# Patient Record
Sex: Male | Born: 1970 | Race: White | Hispanic: No | Smoking: Current every day smoker
Health system: Southern US, Community
[De-identification: ages and names within clinical notes are randomized; demographics above are authoritative.]

## PROBLEM LIST (undated history)

## (undated) DIAGNOSIS — F32A Depression, unspecified: Secondary | ICD-10-CM

## (undated) DIAGNOSIS — F1921 Other psychoactive substance dependence, in remission: Secondary | ICD-10-CM

## (undated) DIAGNOSIS — K219 Gastro-esophageal reflux disease without esophagitis: Secondary | ICD-10-CM

## (undated) DIAGNOSIS — Z9289 Personal history of other medical treatment: Secondary | ICD-10-CM

## (undated) DIAGNOSIS — F4310 Post-traumatic stress disorder, unspecified: Secondary | ICD-10-CM

## (undated) DIAGNOSIS — N200 Calculus of kidney: Secondary | ICD-10-CM

## (undated) DIAGNOSIS — Z09 Encounter for follow-up examination after completed treatment for conditions other than malignant neoplasm: Secondary | ICD-10-CM

## (undated) DIAGNOSIS — F419 Anxiety disorder, unspecified: Secondary | ICD-10-CM

## (undated) DIAGNOSIS — F329 Major depressive disorder, single episode, unspecified: Secondary | ICD-10-CM

## (undated) DIAGNOSIS — Z8781 Personal history of (healed) traumatic fracture: Secondary | ICD-10-CM

## (undated) DIAGNOSIS — Z8739 Personal history of other diseases of the musculoskeletal system and connective tissue: Secondary | ICD-10-CM

## (undated) HISTORY — DX: Gastro-esophageal reflux disease without esophagitis: K21.9

## (undated) HISTORY — PX: PLEURAL SCARIFICATION: SHX748

---

## 2001-03-28 ENCOUNTER — Encounter: Payer: Self-pay | Admitting: Emergency Medicine

## 2001-03-28 ENCOUNTER — Emergency Department (HOSPITAL_COMMUNITY): Admission: EM | Admit: 2001-03-28 | Discharge: 2001-03-28 | Payer: Self-pay | Admitting: Emergency Medicine

## 2003-01-04 ENCOUNTER — Emergency Department (HOSPITAL_COMMUNITY): Admission: EM | Admit: 2003-01-04 | Discharge: 2003-01-04 | Payer: Self-pay | Admitting: *Deleted

## 2003-04-27 ENCOUNTER — Emergency Department (HOSPITAL_COMMUNITY): Admission: EM | Admit: 2003-04-27 | Discharge: 2003-04-27 | Payer: Self-pay | Admitting: Emergency Medicine

## 2003-04-27 ENCOUNTER — Encounter: Payer: Self-pay | Admitting: Emergency Medicine

## 2003-05-29 ENCOUNTER — Encounter: Payer: Self-pay | Admitting: Emergency Medicine

## 2003-05-29 ENCOUNTER — Emergency Department (HOSPITAL_COMMUNITY): Admission: EM | Admit: 2003-05-29 | Discharge: 2003-05-29 | Payer: Self-pay | Admitting: Emergency Medicine

## 2003-07-18 ENCOUNTER — Emergency Department (HOSPITAL_COMMUNITY): Admission: EM | Admit: 2003-07-18 | Discharge: 2003-07-18 | Payer: Self-pay | Admitting: Emergency Medicine

## 2003-07-18 ENCOUNTER — Encounter: Payer: Self-pay | Admitting: Emergency Medicine

## 2003-09-05 ENCOUNTER — Emergency Department (HOSPITAL_COMMUNITY): Admission: EM | Admit: 2003-09-05 | Discharge: 2003-09-05 | Payer: Self-pay | Admitting: Emergency Medicine

## 2004-02-06 ENCOUNTER — Emergency Department (HOSPITAL_COMMUNITY): Admission: EM | Admit: 2004-02-06 | Discharge: 2004-02-06 | Payer: Self-pay | Admitting: Emergency Medicine

## 2005-06-10 ENCOUNTER — Emergency Department (HOSPITAL_COMMUNITY): Admission: EM | Admit: 2005-06-10 | Discharge: 2005-06-10 | Payer: Self-pay | Admitting: Emergency Medicine

## 2005-06-29 ENCOUNTER — Emergency Department (HOSPITAL_COMMUNITY): Admission: EM | Admit: 2005-06-29 | Discharge: 2005-06-29 | Payer: Self-pay | Admitting: Emergency Medicine

## 2005-09-02 ENCOUNTER — Emergency Department (HOSPITAL_COMMUNITY): Admission: EM | Admit: 2005-09-02 | Discharge: 2005-09-02 | Payer: Self-pay | Admitting: Emergency Medicine

## 2005-10-15 ENCOUNTER — Emergency Department (HOSPITAL_COMMUNITY): Admission: EM | Admit: 2005-10-15 | Discharge: 2005-10-15 | Payer: Self-pay | Admitting: Emergency Medicine

## 2005-10-17 ENCOUNTER — Emergency Department (HOSPITAL_COMMUNITY): Admission: EM | Admit: 2005-10-17 | Discharge: 2005-10-17 | Payer: Self-pay | Admitting: Emergency Medicine

## 2006-01-14 ENCOUNTER — Emergency Department (HOSPITAL_COMMUNITY): Admission: EM | Admit: 2006-01-14 | Discharge: 2006-01-14 | Payer: Self-pay | Admitting: Emergency Medicine

## 2006-12-16 ENCOUNTER — Emergency Department (HOSPITAL_COMMUNITY): Admission: EM | Admit: 2006-12-16 | Discharge: 2006-12-16 | Payer: Self-pay | Admitting: Emergency Medicine

## 2007-04-16 ENCOUNTER — Emergency Department (HOSPITAL_COMMUNITY): Admission: EM | Admit: 2007-04-16 | Discharge: 2007-04-17 | Payer: Self-pay | Admitting: Emergency Medicine

## 2007-07-21 ENCOUNTER — Emergency Department (HOSPITAL_COMMUNITY): Admission: EM | Admit: 2007-07-21 | Discharge: 2007-07-21 | Payer: Self-pay | Admitting: Emergency Medicine

## 2007-10-01 ENCOUNTER — Emergency Department (HOSPITAL_COMMUNITY): Admission: EM | Admit: 2007-10-01 | Discharge: 2007-10-01 | Payer: Self-pay | Admitting: Emergency Medicine

## 2007-10-05 ENCOUNTER — Emergency Department (HOSPITAL_COMMUNITY): Admission: EM | Admit: 2007-10-05 | Discharge: 2007-10-05 | Payer: Self-pay | Admitting: Emergency Medicine

## 2007-10-11 ENCOUNTER — Emergency Department (HOSPITAL_COMMUNITY): Admission: EM | Admit: 2007-10-11 | Discharge: 2007-10-11 | Payer: Self-pay | Admitting: Emergency Medicine

## 2007-10-12 ENCOUNTER — Emergency Department (HOSPITAL_COMMUNITY): Admission: EM | Admit: 2007-10-12 | Discharge: 2007-10-14 | Payer: Self-pay | Admitting: Emergency Medicine

## 2007-10-17 ENCOUNTER — Emergency Department (HOSPITAL_COMMUNITY): Admission: EM | Admit: 2007-10-17 | Discharge: 2007-10-17 | Payer: Self-pay | Admitting: Emergency Medicine

## 2007-10-21 ENCOUNTER — Emergency Department (HOSPITAL_COMMUNITY): Admission: EM | Admit: 2007-10-21 | Discharge: 2007-10-22 | Payer: Self-pay | Admitting: Emergency Medicine

## 2007-10-29 ENCOUNTER — Emergency Department (HOSPITAL_COMMUNITY): Admission: EM | Admit: 2007-10-29 | Discharge: 2007-10-29 | Payer: Self-pay | Admitting: Emergency Medicine

## 2007-11-01 ENCOUNTER — Emergency Department (HOSPITAL_COMMUNITY): Admission: EM | Admit: 2007-11-01 | Discharge: 2007-11-01 | Payer: Self-pay | Admitting: Emergency Medicine

## 2007-11-22 ENCOUNTER — Emergency Department (HOSPITAL_COMMUNITY): Admission: EM | Admit: 2007-11-22 | Discharge: 2007-11-22 | Payer: Self-pay | Admitting: Emergency Medicine

## 2008-03-03 ENCOUNTER — Emergency Department (HOSPITAL_COMMUNITY): Admission: EM | Admit: 2008-03-03 | Discharge: 2008-03-03 | Payer: Self-pay | Admitting: Emergency Medicine

## 2008-03-08 ENCOUNTER — Emergency Department (HOSPITAL_COMMUNITY): Admission: EM | Admit: 2008-03-08 | Discharge: 2008-03-09 | Payer: Self-pay | Admitting: Emergency Medicine

## 2008-03-14 ENCOUNTER — Emergency Department (HOSPITAL_COMMUNITY): Admission: EM | Admit: 2008-03-14 | Discharge: 2008-03-14 | Payer: Self-pay | Admitting: Emergency Medicine

## 2008-04-07 ENCOUNTER — Emergency Department (HOSPITAL_COMMUNITY): Admission: EM | Admit: 2008-04-07 | Discharge: 2008-04-07 | Payer: Self-pay | Admitting: Emergency Medicine

## 2009-02-03 IMAGING — CT CT PELVIS W/O CM
1 of 2 series · 15 of 32 positions shown, 19 images · non-contrast
Comparison: 10/17/2005

CT ABDOMEN

CLINICAL DATA: 37-year-old with right flank pain.

CT ABDOMEN AND PELVIS WITHOUT CONTRAST
TECHNIQUE: Multidetector CT imaging of the abdomen and pelvis was
performed following the standard
protocol without intravenous contrast.

[Series 2: stone 5.0 b40f · axial · 0.70mm/px · z∈[-512,-82]mm · 15 of 94 slices shown, 19 images]
[im 4/94  soft-tissue]
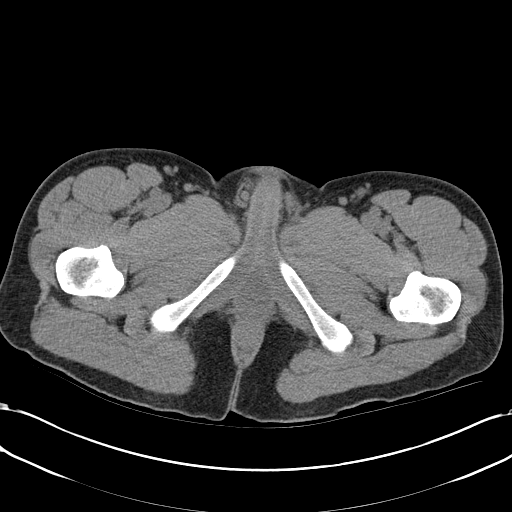
[im 4/94  bone]
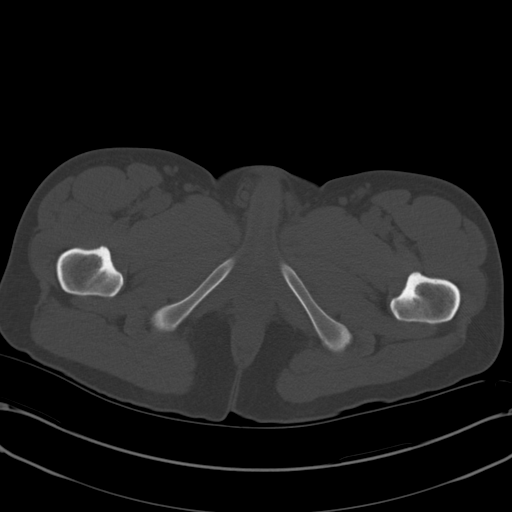
[im 12/94  soft-tissue]
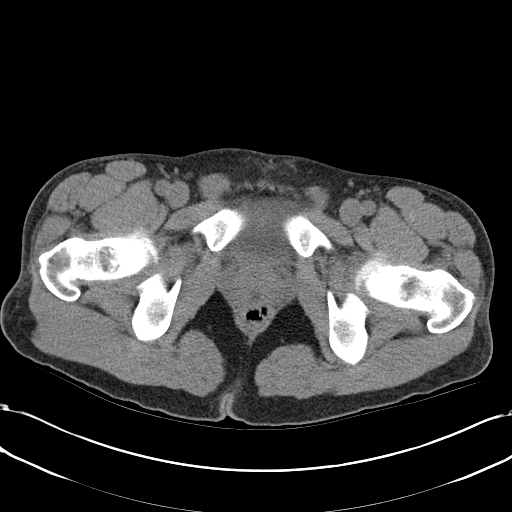
[im 19/94  soft-tissue]
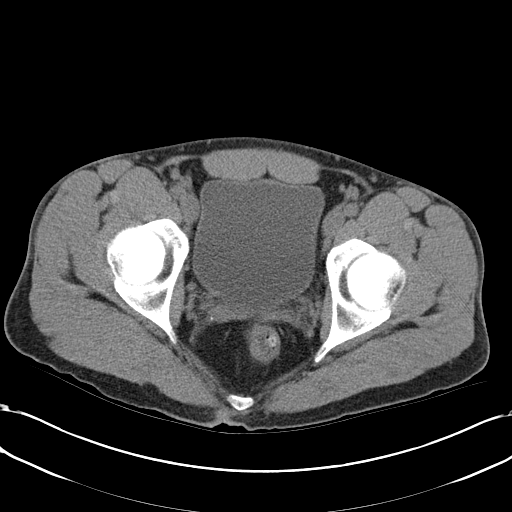
[im 27/94  soft-tissue]
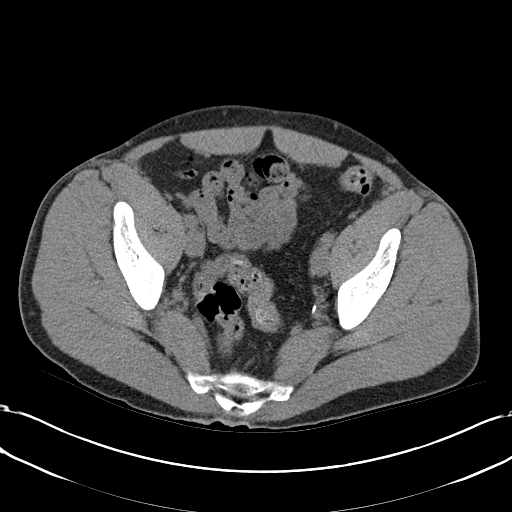
[im 34/94  soft-tissue]
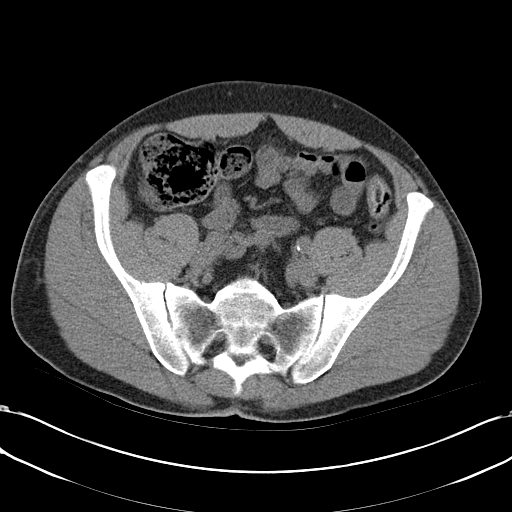
[im 41/94  soft-tissue]
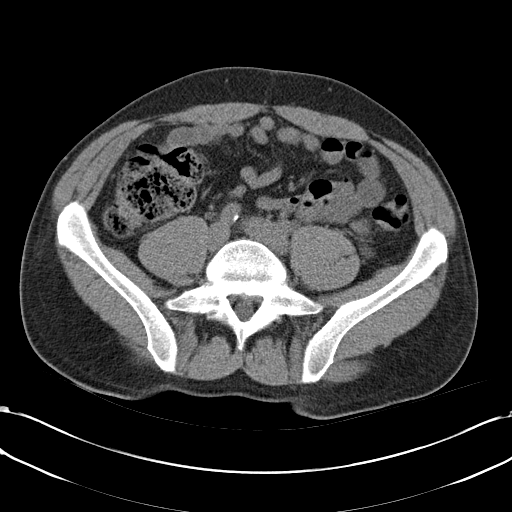
[im 49/94  soft-tissue]
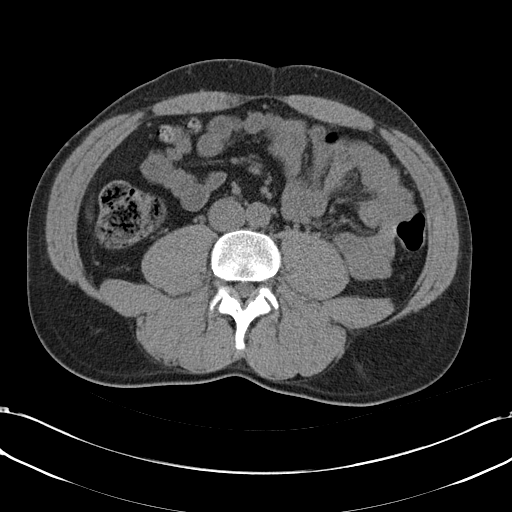
[im 53/94  soft-tissue]
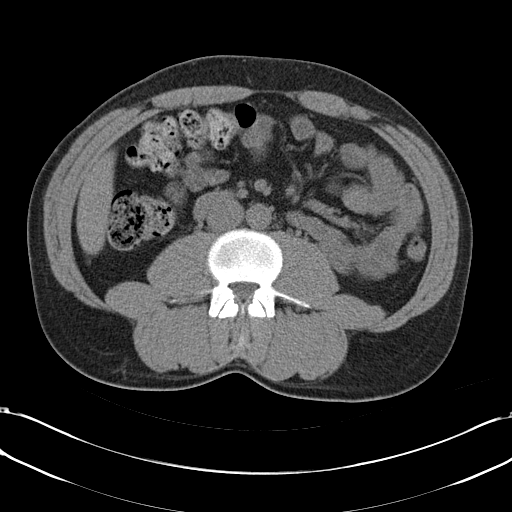
[im 60/94  soft-tissue]
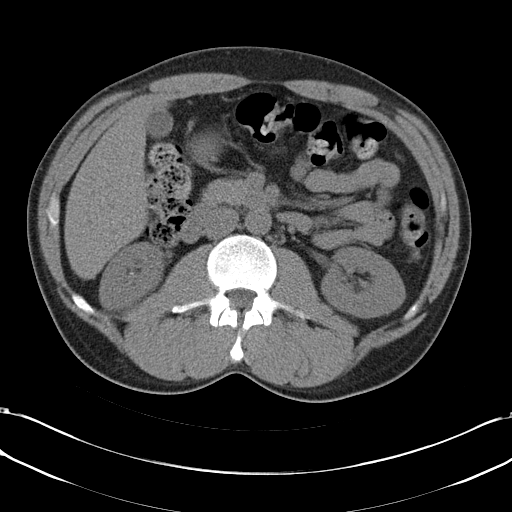
[im 60/94  bone]
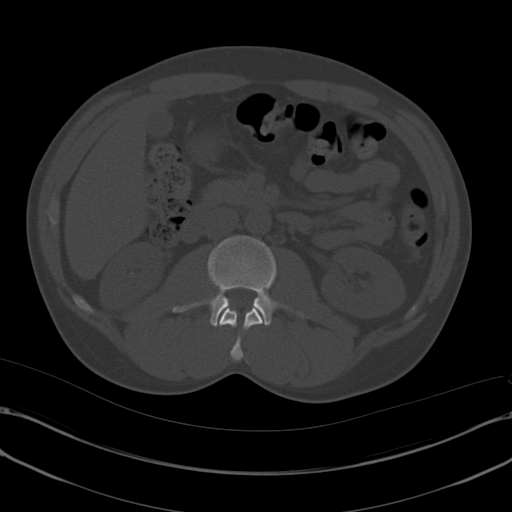
[im 67/94  soft-tissue]
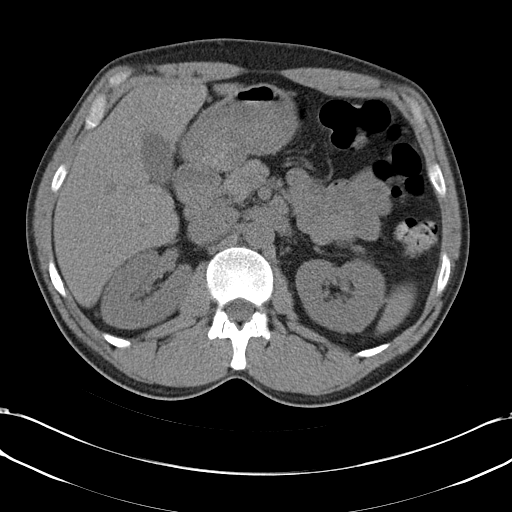
[im 75/94  soft-tissue]
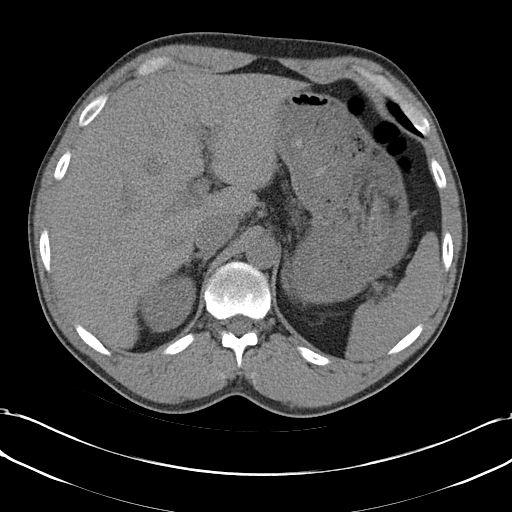
[im 79/94  lung]
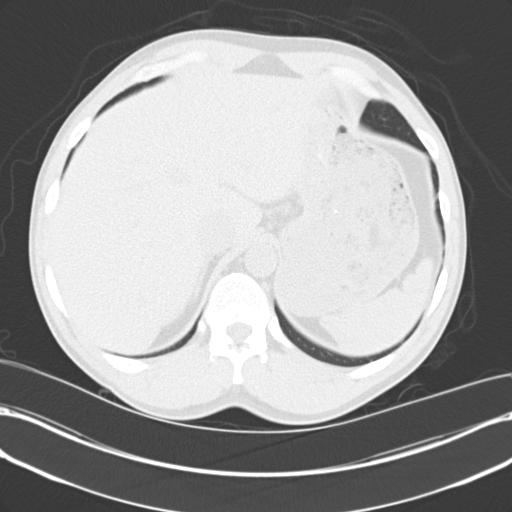
[im 82/94  soft-tissue]
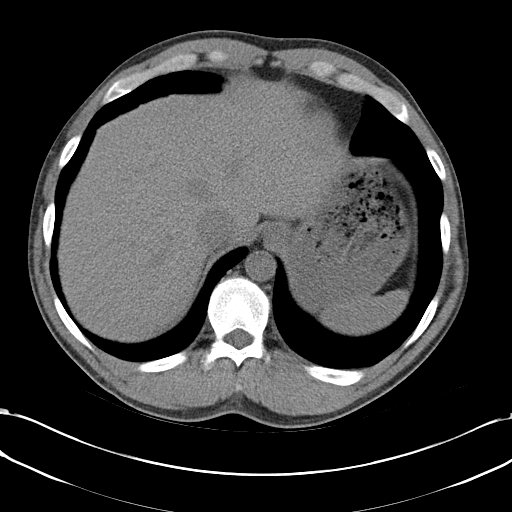
[im 82/94  lung]
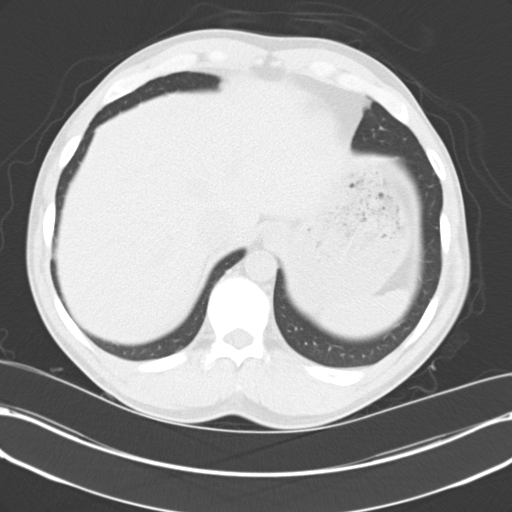
[im 86/94  lung]
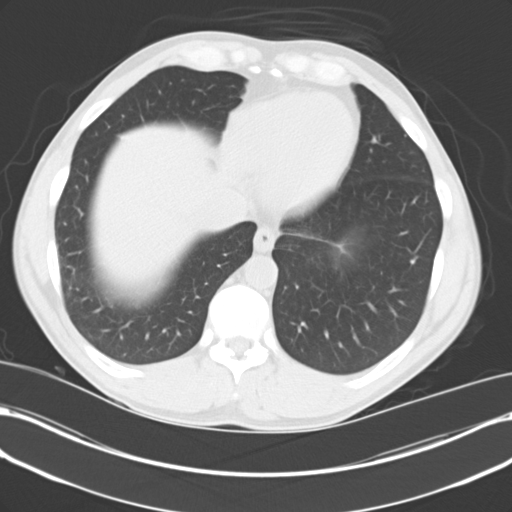
[im 90/94  soft-tissue]
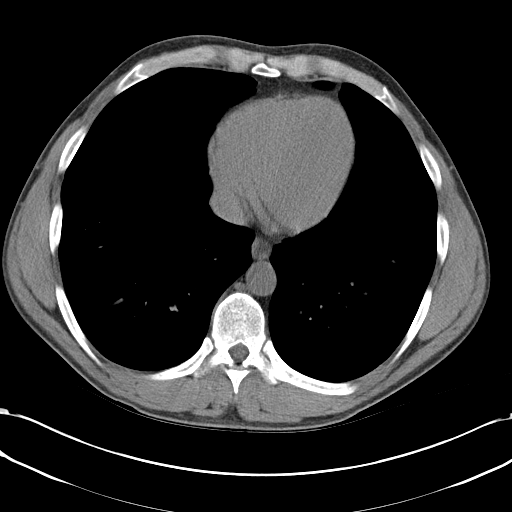
[im 90/94  lung]
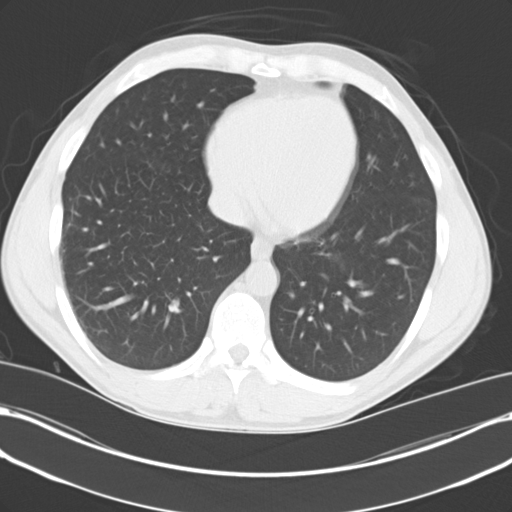

[15 of 32 positions shown; findings below may reference images not displayed]

FINDINGS: Evaluation of the intra-abdominal structures is
inherently limited without intravenous contrast.  The lung bases
are clear.  There is no evidence for free air.  There are no gross
abnormality involving the liver, gallbladder, spleen, pancreas,
adrenal glands or stomach.  There is a large amount of gastric
contents within the stomach.  There is a punctate stone in the
upper pole of the right kidney and a second punctate stone in the
lower pole of the right kidney.  No evidence for hydronephrosis or
ureteral stones.  There is no significant free fluid or
lymphadenopathy in the abdomen.  No acute bony abnormalities.
There is a stable sclerosis along the inferior endplate of T10.
IMPRESSION: Two punctate stones involving the right kidney without
hydronephrosis.

CT PELVIS
FINDINGS: Normal appearance of the distal colon, urinary bladder
and seminal vesicles.  Again seen is a low density area along the
posterior aspect of the prostate gland which is unchanged.  Cannot
exclude a small cystic structure in this area.  No significant free
fluid or lymphadenopathy in the pelvis.
IMPRESSION: Stable CT the pelvis without acute findings.

## 2009-03-27 ENCOUNTER — Emergency Department (HOSPITAL_COMMUNITY): Admission: EM | Admit: 2009-03-27 | Discharge: 2009-03-27 | Payer: Self-pay | Admitting: Emergency Medicine

## 2010-07-23 ENCOUNTER — Emergency Department (HOSPITAL_COMMUNITY): Admission: EM | Admit: 2010-07-23 | Discharge: 2010-07-23 | Payer: Self-pay | Admitting: Emergency Medicine

## 2010-08-03 ENCOUNTER — Emergency Department (HOSPITAL_COMMUNITY): Admission: EM | Admit: 2010-08-03 | Discharge: 2010-08-03 | Payer: Self-pay | Admitting: Emergency Medicine

## 2010-12-04 LAB — URINE CULTURE
Colony Count: NO GROWTH
Culture  Setup Time: 201111121943
Culture: NO GROWTH

## 2010-12-04 LAB — COMPREHENSIVE METABOLIC PANEL
ALT: 49 U/L (ref 0–53)
AST: 30 U/L (ref 0–37)
Albumin: 3.8 g/dL (ref 3.5–5.2)
Alkaline Phosphatase: 51 U/L (ref 39–117)
Chloride: 105 mEq/L (ref 96–112)
Potassium: 3.4 mEq/L — ABNORMAL LOW (ref 3.5–5.1)
Sodium: 138 mEq/L (ref 135–145)
Total Bilirubin: 0.5 mg/dL (ref 0.3–1.2)
Total Protein: 6.5 g/dL (ref 6.0–8.3)

## 2010-12-04 LAB — DIFFERENTIAL
Basophils Absolute: 0.1 10*3/uL (ref 0.0–0.1)
Basophils Relative: 1 % (ref 0–1)
Eosinophils Absolute: 0.1 10*3/uL (ref 0.0–0.7)
Eosinophils Relative: 1 % (ref 0–5)
Monocytes Absolute: 0.6 10*3/uL (ref 0.1–1.0)
Monocytes Relative: 5 % (ref 3–12)

## 2010-12-04 LAB — RAPID URINE DRUG SCREEN, HOSP PERFORMED
Amphetamines: NOT DETECTED
Barbiturates: NOT DETECTED
Opiates: POSITIVE — AB
Tetrahydrocannabinol: POSITIVE — AB

## 2010-12-04 LAB — URINALYSIS, ROUTINE W REFLEX MICROSCOPIC
Bilirubin Urine: NEGATIVE
Glucose, UA: NEGATIVE mg/dL
Protein, ur: NEGATIVE mg/dL
Urobilinogen, UA: 1 mg/dL (ref 0.0–1.0)

## 2010-12-04 LAB — ETHANOL: Alcohol, Ethyl (B): 9 mg/dL (ref 0–10)

## 2010-12-04 LAB — CBC
MCV: 90.4 fL (ref 78.0–100.0)
Platelets: 198 10*3/uL (ref 150–400)
RBC: 4.1 MIL/uL — ABNORMAL LOW (ref 4.22–5.81)
RDW: 14.8 % (ref 11.5–15.5)
WBC: 10.9 10*3/uL — ABNORMAL HIGH (ref 4.0–10.5)

## 2010-12-04 LAB — URINE MICROSCOPIC-ADD ON

## 2010-12-04 LAB — APTT: aPTT: 29 seconds (ref 24–37)

## 2010-12-05 LAB — URINALYSIS, ROUTINE W REFLEX MICROSCOPIC
Glucose, UA: NEGATIVE mg/dL
Specific Gravity, Urine: 1.02 (ref 1.005–1.030)
Urobilinogen, UA: 0.2 mg/dL (ref 0.0–1.0)
pH: 6.5 (ref 5.0–8.0)

## 2010-12-05 LAB — COMPREHENSIVE METABOLIC PANEL
ALT: 46 U/L (ref 0–53)
Albumin: 3.9 g/dL (ref 3.5–5.2)
Alkaline Phosphatase: 47 U/L (ref 39–117)
Chloride: 108 mEq/L (ref 96–112)
Glucose, Bld: 101 mg/dL — ABNORMAL HIGH (ref 70–99)
Potassium: 3.5 mEq/L (ref 3.5–5.1)
Sodium: 140 mEq/L (ref 135–145)
Total Protein: 7.1 g/dL (ref 6.0–8.3)

## 2010-12-05 LAB — DIFFERENTIAL
Basophils Relative: 0 % (ref 0–1)
Eosinophils Absolute: 0.1 10*3/uL (ref 0.0–0.7)
Monocytes Absolute: 0.5 10*3/uL (ref 0.1–1.0)
Monocytes Relative: 5 % (ref 3–12)

## 2010-12-05 LAB — CBC
HCT: 38.8 % — ABNORMAL LOW (ref 39.0–52.0)
Platelets: 212 10*3/uL (ref 150–400)
RBC: 4.3 MIL/uL (ref 4.22–5.81)
RDW: 14.2 % (ref 11.5–15.5)
WBC: 10.3 10*3/uL (ref 4.0–10.5)

## 2010-12-05 LAB — URINE MICROSCOPIC-ADD ON

## 2011-01-12 ENCOUNTER — Emergency Department (HOSPITAL_COMMUNITY): Payer: Self-pay

## 2011-01-12 ENCOUNTER — Emergency Department (HOSPITAL_COMMUNITY)
Admission: EM | Admit: 2011-01-12 | Discharge: 2011-01-12 | Disposition: A | Discharge status: Home / Self Care | Payer: Self-pay | Attending: Emergency Medicine | Admitting: Emergency Medicine

## 2011-01-12 DIAGNOSIS — R1032 Left lower quadrant pain: Secondary | ICD-10-CM | POA: Insufficient documentation

## 2011-01-12 DIAGNOSIS — R1031 Right lower quadrant pain: Secondary | ICD-10-CM | POA: Insufficient documentation

## 2011-01-12 DIAGNOSIS — R319 Hematuria, unspecified: Secondary | ICD-10-CM | POA: Insufficient documentation

## 2011-01-12 LAB — BASIC METABOLIC PANEL
BUN: 8 mg/dL (ref 6–23)
Creatinine, Ser: 0.77 mg/dL (ref 0.4–1.5)
GFR calc Af Amer: >60 mL/min (ref >60)
GFR calc non Af Amer: >60 mL/min (ref >60)
Potassium: 3.6 mEq/L (ref 3.5–5.1)

## 2011-01-12 LAB — URINE MICROSCOPIC-ADD ON

## 2011-01-12 LAB — URINALYSIS, ROUTINE W REFLEX MICROSCOPIC
Bilirubin Urine: NEGATIVE
Ketones, ur: NEGATIVE mg/dL
Protein, ur: NEGATIVE mg/dL
Urobilinogen, UA: 0.2 mg/dL (ref 0.0–1.0)

## 2011-01-13 LAB — URINE CULTURE

## 2011-06-13 LAB — DIFFERENTIAL
Basophils Absolute: 0.1
Basophils Absolute: 0.1
Eosinophils Absolute: 0.4
Eosinophils Relative: 0
Eosinophils Relative: 3
Lymphocytes Relative: 16
Monocytes Absolute: 1.1 — ABNORMAL HIGH
Neutro Abs: 10.4 — ABNORMAL HIGH

## 2011-06-13 LAB — BASIC METABOLIC PANEL
BUN: 13
BUN: 22
CO2: 29
Calcium: 9.3
Calcium: 9.6
Chloride: 103
Creatinine, Ser: 1.13
GFR calc non Af Amer: >60
GFR calc non Af Amer: >60
Glucose, Bld: 112 — ABNORMAL HIGH
Glucose, Bld: 126 — ABNORMAL HIGH
Glucose, Bld: 64 — ABNORMAL LOW
Potassium: 2.8 — ABNORMAL LOW
Sodium: 141

## 2011-06-13 LAB — URINALYSIS, ROUTINE W REFLEX MICROSCOPIC
Ketones, ur: NEGATIVE
Nitrite: NEGATIVE
Specific Gravity, Urine: 1.02
pH: 6

## 2011-06-13 LAB — CBC
HCT: 39.9
Hemoglobin: 13.1
MCV: 91.3
Platelets: 248
Platelets: 275
Platelets: 327
RDW: 15.2
RDW: 15.3
RDW: 15.7 — ABNORMAL HIGH

## 2011-06-13 LAB — RAPID URINE DRUG SCREEN, HOSP PERFORMED
Amphetamines: NOT DETECTED
Barbiturates: NOT DETECTED
Cocaine: POSITIVE — AB
Cocaine: POSITIVE — AB
Opiates: POSITIVE — AB
Opiates: POSITIVE — AB
Tetrahydrocannabinol: POSITIVE — AB

## 2011-06-13 LAB — POTASSIUM: Potassium: 3.6

## 2011-06-14 LAB — RAPID URINE DRUG SCREEN, HOSP PERFORMED
Benzodiazepines: POSITIVE — AB
Tetrahydrocannabinol: NOT DETECTED

## 2011-06-14 LAB — I-STAT 8, (EC8 V) (CONVERTED LAB)
Acid-Base Excess: 1
Bicarbonate: 28 — ABNORMAL HIGH
Glucose, Bld: 135 — ABNORMAL HIGH
HCT: 45
Hemoglobin: 15.3
Operator id: 151321
Potassium: 3.8
Sodium: 139
TCO2: 30

## 2011-06-14 LAB — DIFFERENTIAL
Basophils Absolute: 0.1
Eosinophils Relative: 3
Lymphocytes Relative: 29
Lymphs Abs: 2.7
Neutro Abs: 5.7

## 2011-06-14 LAB — POCT I-STAT CREATININE: Operator id: 151321

## 2011-06-14 LAB — CBC
HCT: 40
Hemoglobin: 13.4
Platelets: 299
WBC: 9.3

## 2011-06-14 LAB — ETHANOL: Alcohol, Ethyl (B): <5

## 2011-06-17 LAB — DIFFERENTIAL
Band Neutrophils: 0
Eosinophils Relative: 1
Metamyelocytes Relative: 0
Monocytes Relative: 9
Myelocytes: 0

## 2011-06-17 LAB — BASIC METABOLIC PANEL
Chloride: 109
GFR calc non Af Amer: >60
Glucose, Bld: 107 — ABNORMAL HIGH
Potassium: 4.3
Sodium: 141

## 2011-06-17 LAB — RAPID URINE DRUG SCREEN, HOSP PERFORMED
Amphetamines: NOT DETECTED
Benzodiazepines: POSITIVE — AB
Cocaine: POSITIVE — AB
Opiates: NOT DETECTED

## 2011-06-17 LAB — ACETAMINOPHEN LEVEL: Acetaminophen (Tylenol), Serum: <10 — ABNORMAL LOW

## 2011-06-17 LAB — CBC
HCT: 40.2
Hemoglobin: 14
MCV: 92.3
RDW: 15.1
WBC: 10.8 — ABNORMAL HIGH

## 2011-06-20 LAB — WOUND CULTURE

## 2011-06-21 LAB — URINALYSIS, ROUTINE W REFLEX MICROSCOPIC
Bilirubin Urine: NEGATIVE
Ketones, ur: NEGATIVE
Nitrite: NEGATIVE
Protein, ur: NEGATIVE
Urobilinogen, UA: 0.2

## 2011-09-09 ENCOUNTER — Emergency Department (HOSPITAL_COMMUNITY)
Admission: EM | Admit: 2011-09-09 | Discharge: 2011-09-09 | Disposition: A | Discharge status: Home / Self Care | Payer: Self-pay | Attending: Emergency Medicine | Admitting: Emergency Medicine

## 2011-09-09 ENCOUNTER — Encounter: Payer: Self-pay | Admitting: Emergency Medicine

## 2011-09-09 DIAGNOSIS — M549 Dorsalgia, unspecified: Secondary | ICD-10-CM | POA: Insufficient documentation

## 2011-09-09 DIAGNOSIS — S335XXA Sprain of ligaments of lumbar spine, initial encounter: Secondary | ICD-10-CM | POA: Insufficient documentation

## 2011-09-09 DIAGNOSIS — S39012A Strain of muscle, fascia and tendon of lower back, initial encounter: Secondary | ICD-10-CM

## 2011-09-09 DIAGNOSIS — X503XXA Overexertion from repetitive movements, initial encounter: Secondary | ICD-10-CM | POA: Insufficient documentation

## 2011-09-09 MED ORDER — NAPROXEN 500 MG PO TABS: 500.0000 mg | ORAL_TABLET | Freq: Two times a day (BID) | ORAL | Status: DC

## 2011-09-09 MED ORDER — DIAZEPAM 5 MG PO TABS: 5.0000 mg | ORAL_TABLET | Freq: Two times a day (BID) | ORAL | Status: AC

## 2011-09-09 MED ORDER — HYDROCODONE-ACETAMINOPHEN 5-325 MG PO TABS: 1.0000 | ORAL_TABLET | ORAL | Status: AC | PRN

## 2011-09-09 MED ORDER — PREDNISONE 50 MG PO TABS: 50.0000 mg | ORAL_TABLET | Freq: Every day | ORAL | Status: AC

## 2011-09-09 NOTE — ED Provider Notes (Signed)
History     CSN: [id number] Arrival date & time: 09/09/2011  7:27 AM   First MD Initiated Contact with Patient 09/09/11 [phone]      Chief Complaint  Patient presents with  . Back Pain    (Consider location/radiation/quality/duration/timing/severity/associated sxs/prior treatment) HPI Comments: Patient was lifting heavy cinder blocks yesterday and since that time his increased pain in his left lumbar back.  Pain does radiate down his left leg.  He notes a tingling sensation his left foot but is able to ambulate.  No focal weakness.  No loss of bowel or bladder control.  No prior back injuries or surgeries.  Patient is a 40 y.o. male presenting with back pain. The history is provided by the patient. No language interpreter was used.  Back Pain  This is a new problem. The current episode started yesterday. The problem occurs constantly. The problem has not changed since onset.The pain is associated with lifting heavy objects. The pain is present in the lumbar spine. The quality of the pain is described as shooting. The pain radiates to the left thigh. The pain is moderate. The symptoms are aggravated by bending, certain positions and twisting. Associated symptoms include leg pain, paresthesias and tingling. Pertinent negatives include no chest pain, no fever, no numbness, no weight loss, no headaches, no abdominal pain, no abdominal swelling, no bowel incontinence, no perianal numbness, no bladder incontinence, no dysuria, no pelvic pain, no paresis and no weakness. He has tried NSAIDs for the symptoms. The treatment provided mild relief.    History reviewed. No pertinent past medical history.  History reviewed. No pertinent past surgical history.  History reviewed. No pertinent family history.  History  Substance Use Topics  . Smoking status: Current Everyday Smoker -- 1.0 packs/day    Types: Cigarettes  . Smokeless tobacco: Not on file  . Alcohol Use: No      Review of Systems    Constitutional: Negative.  Negative for fever, chills and weight loss.  HENT: Negative.   Eyes: Negative.  Negative for discharge and redness.  Respiratory: Negative.  Negative for cough and shortness of breath.   Cardiovascular: Negative.  Negative for chest pain.  Gastrointestinal: Negative.  Negative for nausea, vomiting, abdominal pain and bowel incontinence.  Genitourinary: Negative.  Negative for bladder incontinence, dysuria, hematuria and pelvic pain.  Musculoskeletal: Positive for back pain.  Skin: Negative.  Negative for color change and rash.  Neurological: Positive for tingling and paresthesias. Negative for syncope, weakness, numbness and headaches.  Hematological: Negative.  Negative for adenopathy.  Psychiatric/Behavioral: Negative.  Negative for confusion.  All other systems reviewed and are negative.    Allergies  Sulfa antibiotics  Home Medications   Current Outpatient Rx  Name Route Sig Dispense Refill  . IBUPROFEN 200 MG PO TABS Oral Take 400 mg by mouth every 6 (six) hours as needed. pain       BP 130/69  Pulse 84  Temp(Src) 98.6 F (37 C) (Oral)  Resp 16  SpO2 98%  Physical Exam  Constitutional: He is oriented to person, place, and time. He appears well-developed and well-nourished.  Non-toxic appearance. He does not have a sickly appearance.  HENT:  Head: Normocephalic and atraumatic.  Eyes: Conjunctivae, EOM and lids are normal. Pupils are equal, round, and reactive to light.  Neck: Trachea normal, normal range of motion and full passive range of motion without pain. Neck supple.  Cardiovascular: Regular rhythm and normal heart sounds.   Pulmonary/Chest: Effort normal and  breath sounds normal. No respiratory distress.  Abdominal: Soft. Normal appearance. He exhibits no distension. There is no tenderness. There is no rebound and no CVA tenderness.  Musculoskeletal: Normal range of motion.       No focal tenderness over his lumbar spine.  No  step-offs noted.  Mild tenderness palpation over his left paraspinal lumbar region.  Sensation is intact in his left lower leg.  He has 5 out of 5 strength with flexion and extension of his ankle joint without difficulty.  Patient able to ambulate.  Neurological: He is alert and oriented to person, place, and time. He has normal strength.  Skin: Skin is warm, dry and intact. No rash noted.  Psychiatric: He has a normal mood and affect. His behavior is normal. Judgment and thought content normal.    ED Course  Procedures (including critical care time)  Labs Reviewed - No data to display No results found.   No diagnosis found.    MDM  Patient with lumbar strain with possible mild sciatica.  Patient is able to ambulate.  Sensation is intact in his left lower leg.  He has good strength in the left lower leg as well.  Patient can be treated conservatively with pain medications muscle relaxants and steroids at this time and has been given instructions to followup if his symptoms are not improving.        Nat Christen, MD 09/09/11 [phone]

## 2011-09-09 NOTE — ED Notes (Signed)
Pt states was moving center blocks yesterday and hurt back, is having pain today

## 2011-10-21 ENCOUNTER — Encounter (HOSPITAL_COMMUNITY): Payer: Self-pay | Admitting: Emergency Medicine

## 2011-10-21 ENCOUNTER — Emergency Department (HOSPITAL_COMMUNITY)
Admission: EM | Admit: 2011-10-21 | Discharge: 2011-10-21 | Disposition: A | Discharge status: Home / Self Care | Payer: Self-pay | Attending: Emergency Medicine | Admitting: Emergency Medicine

## 2011-10-21 ENCOUNTER — Emergency Department (HOSPITAL_COMMUNITY): Payer: Self-pay

## 2011-10-21 DIAGNOSIS — N342 Other urethritis: Secondary | ICD-10-CM | POA: Insufficient documentation

## 2011-10-21 DIAGNOSIS — R109 Unspecified abdominal pain: Secondary | ICD-10-CM | POA: Insufficient documentation

## 2011-10-21 DIAGNOSIS — R110 Nausea: Secondary | ICD-10-CM | POA: Insufficient documentation

## 2011-10-21 DIAGNOSIS — R319 Hematuria, unspecified: Secondary | ICD-10-CM | POA: Insufficient documentation

## 2011-10-21 DIAGNOSIS — F17200 Nicotine dependence, unspecified, uncomplicated: Secondary | ICD-10-CM | POA: Insufficient documentation

## 2011-10-21 HISTORY — DX: Calculus of kidney: N20.0

## 2011-10-21 LAB — URINE MICROSCOPIC-ADD ON

## 2011-10-21 LAB — URINALYSIS, ROUTINE W REFLEX MICROSCOPIC
Bilirubin Urine: NEGATIVE
Glucose, UA: NEGATIVE mg/dL
Ketones, ur: NEGATIVE mg/dL
Protein, ur: NEGATIVE mg/dL

## 2011-10-21 MED ORDER — ONDANSETRON HCL 4 MG/2ML IJ SOLN
4.0000 mg | Freq: Once | INTRAMUSCULAR | Status: AC
  Administered 2011-10-21: 4 mg via INTRAVENOUS
  Filled 2011-10-21: qty 2

## 2011-10-21 MED ORDER — KETOROLAC TROMETHAMINE 30 MG/ML IJ SOLN
30.0000 mg | Freq: Once | INTRAMUSCULAR | Status: AC
  Administered 2011-10-21: 30 mg via INTRAVENOUS
  Filled 2011-10-21: qty 1

## 2011-10-21 MED ORDER — HYDROMORPHONE HCL PF 1 MG/ML IJ SOLN
1.0000 mg | Freq: Once | INTRAMUSCULAR | Status: AC
  Administered 2011-10-21: 1 mg via INTRAVENOUS
  Filled 2011-10-21: qty 1

## 2011-10-21 MED ORDER — HYDROCODONE-ACETAMINOPHEN 5-500 MG PO TABS: 1.0000 | ORAL_TABLET | Freq: Four times a day (QID) | ORAL | Status: AC | PRN

## 2011-10-21 MED ORDER — CIPROFLOXACIN HCL 500 MG PO TABS: 500.0000 mg | ORAL_TABLET | Freq: Two times a day (BID) | ORAL | Status: AC

## 2011-10-21 MED ORDER — CIPROFLOXACIN HCL 500 MG PO TABS
500.0000 mg | ORAL_TABLET | Freq: Once | ORAL | Status: AC
  Administered 2011-10-21: 500 mg via ORAL
  Filled 2011-10-21: qty 1

## 2011-10-21 NOTE — ED Provider Notes (Signed)
History     CSN: [id number]  Arrival date & time 10/21/11  0753   First MD Initiated Contact with Patient 10/21/11 0754      No chief complaint on file.   (Consider location/radiation/quality/duration/timing/severity/associated sxs/prior treatment) Patient is a 41 y.o. male presenting with flank pain. The history is provided by the patient.  Flank Pain This is a new problem. The current episode started yesterday. The problem occurs constantly. The problem has been unchanged. Associated symptoms include abdominal pain, nausea and urinary symptoms. Pertinent negatives include no chills, diaphoresis, fever or vomiting. The symptoms are aggravated by nothing. He has tried NSAIDs for the symptoms. The treatment provided no relief.  Pt states he has had a kidney stone one time before, and this pain feels the same. Reports blood in urine. Denies fever, chills, pain with urination, vomiting, diarrhea.   No past medical history on file.  No past surgical history on file.  No family history on file.  History  Substance Use Topics  . Smoking status: Current Everyday Smoker -- 1.0 packs/day    Types: Cigarettes  . Smokeless tobacco: Not on file  . Alcohol Use: No      Review of Systems  Constitutional: Negative for fever, chills and diaphoresis.  HENT: Negative.   Eyes: Negative.   Respiratory: Negative.   Cardiovascular: Negative.   Gastrointestinal: Positive for nausea and abdominal pain. Negative for vomiting.  Genitourinary: Positive for dysuria, hematuria and flank pain. Negative for frequency, difficulty urinating and testicular pain.  Musculoskeletal: Negative.   Skin: Negative.   Neurological: Negative.   Psychiatric/Behavioral: Negative.     Allergies  Sulfa antibiotics  Home Medications   Current Outpatient Rx  Name Route Sig Dispense Refill  . IBUPROFEN 200 MG PO TABS Oral Take 400 mg by mouth every 6 (six) hours as needed. pain     . NAPROXEN 500 MG PO TABS  Oral Take 1 tablet (500 mg total) by mouth 2 (two) times daily. 30 tablet 0    BP 119/77  Pulse 84  Temp(Src) 97.6 F (36.4 C) (Oral)  Resp 18  SpO2 100%  Physical Exam  Nursing note and vitals reviewed. Constitutional: He is oriented to person, place, and time. He appears well-developed and well-nourished.       Uncomfortable appearing  HENT:  Head: Normocephalic and atraumatic.  Eyes: Conjunctivae are normal.  Neck: Neck supple.  Cardiovascular: Normal rate, regular rhythm and normal heart sounds.   Pulmonary/Chest: Effort normal and breath sounds normal. No respiratory distress.  Abdominal: Soft. Bowel sounds are normal. He exhibits no distension. There is no tenderness.  Genitourinary:       No CVA tenderness  Neurological: He is alert and oriented to person, place, and time.  Skin: Skin is warm and dry.  Psychiatric: He has a normal mood and affect.    ED Course  Procedures (including critical care time)  8:12 AM Pt seen and examined. Suspect a kidney stone, will get urine, renal function, CT abd/pelvis wo contrast. Pain medications ordered Results for orders placed during the hospital encounter of 10/21/11  URINALYSIS, ROUTINE W REFLEX MICROSCOPIC      Component Value Range   Color, Urine YELLOW  YELLOW    APPearance CLEAR  CLEAR    Specific Gravity, Urine 1.011  1.005 - 1.030    pH 6.5  5.0 - 8.0    Glucose, UA NEGATIVE  NEGATIVE (mg/dL)   Hgb urine dipstick LARGE (*) NEGATIVE  Bilirubin Urine NEGATIVE  NEGATIVE    Ketones, ur NEGATIVE  NEGATIVE (mg/dL)   Protein, ur NEGATIVE  NEGATIVE (mg/dL)   Urobilinogen, UA 1.0  0.0 - 1.0 (mg/dL)   Nitrite NEGATIVE  NEGATIVE    Leukocytes, UA TRACE (*) NEGATIVE   URINE MICROSCOPIC-ADD ON      Component Value Range   Squamous Epithelial / LPF RARE  RARE    WBC, UA 0-2  <3 (WBC/hpf)   RBC / HPF TOO NUMEROUS TO COUNT  <3 (RBC/hpf)   Bacteria, UA MANY (*) RARE    Ct Abdomen Pelvis Wo Contrast  10/21/2011  *RADIOLOGY  REPORT*  Clinical Data: 41 year old male with right flank pain.  CT ABDOMEN AND PELVIS WITHOUT CONTRAST  Technique:  Multidetector CT imaging of the abdomen and pelvis was performed following the standard protocol without intravenous contrast.  Comparison: None  Findings: The lung bases are clear.  The liver, gallbladder, spleen, adrenal glands, gallbladder and pancreas are unremarkable. Please note that parenchymal abnormalities may be missed as intravenous contrast was not administered. There is no evidence of urinary calculi or hydronephrosis.  No free fluid, enlarged lymph nodes, biliary dilation or abdominal aortic aneurysm identified. The bowel, appendix and bladder are unremarkable.  No acute or suspicious bony abnormalities are identified.  Moderate degenerative disc disease and broad-based disc bulge at L5-S1 noted.  IMPRESSION: No acute abnormalities.  No renal or urinary abnormalities identified.  Normal appendix.  Original Report Authenticated By: Rosendo Gros, M.D.    9:39 AM CT negative for kidney stone. Urine with many bacteria but no WBCs, will culture, GC/Chlamydia sent as well. Pt does state that it hurts now when he urinated here in ED, possible infection. Willcover with sypro since symptomatic. Will d/c home with pain medications. Possible recetnly passed stone?   No diagnosis found.    MDM          [name], PA 10/21/11 0940  [name], PA 10/21/11 0945

## 2011-10-21 NOTE — ED Provider Notes (Signed)
Medical screening examination/treatment/procedure(s) were performed by non-physician practitioner and as supervising physician I was immediately available for consultation/collaboration.  Nicholes Stairs, MD 10/21/11 1017

## 2011-10-22 LAB — GC/CHLAMYDIA PROBE AMP, URINE
Chlamydia, Swab/Urine, PCR: NEGATIVE
GC Probe Amp, Urine: NEGATIVE

## 2011-10-22 LAB — URINE CULTURE

## 2011-11-02 ENCOUNTER — Encounter (HOSPITAL_COMMUNITY): Payer: Self-pay

## 2011-11-02 ENCOUNTER — Emergency Department (HOSPITAL_COMMUNITY)
Admission: EM | Admit: 2011-11-02 | Discharge: 2011-11-02 | Disposition: A | Discharge status: Home / Self Care | Payer: Self-pay | Attending: Emergency Medicine | Admitting: Emergency Medicine

## 2011-11-02 DIAGNOSIS — K029 Dental caries, unspecified: Secondary | ICD-10-CM | POA: Insufficient documentation

## 2011-11-02 DIAGNOSIS — F17200 Nicotine dependence, unspecified, uncomplicated: Secondary | ICD-10-CM | POA: Insufficient documentation

## 2011-11-02 DIAGNOSIS — R221 Localized swelling, mass and lump, neck: Secondary | ICD-10-CM | POA: Insufficient documentation

## 2011-11-02 DIAGNOSIS — K089 Disorder of teeth and supporting structures, unspecified: Secondary | ICD-10-CM | POA: Insufficient documentation

## 2011-11-02 DIAGNOSIS — R220 Localized swelling, mass and lump, head: Secondary | ICD-10-CM | POA: Insufficient documentation

## 2011-11-02 DIAGNOSIS — K0889 Other specified disorders of teeth and supporting structures: Secondary | ICD-10-CM

## 2011-11-02 MED ORDER — AMOXICILLIN 500 MG PO CAPS: 500.0000 mg | ORAL_CAPSULE | Freq: Three times a day (TID) | ORAL | Status: AC

## 2011-11-02 MED ORDER — HYDROCODONE-ACETAMINOPHEN 5-325 MG PO TABS: ORAL_TABLET | ORAL | Status: AC

## 2011-11-02 NOTE — ED Notes (Signed)
Pt a/ox4. Resp even and unlabored. NAD at this time. D/C instructions reviewed with pt. Pt verbalized understanding. Pt ambulated to lobby with steady gate.  

## 2011-11-02 NOTE — ED Provider Notes (Signed)
Medical screening examination/treatment/procedure(s) were performed by non-physician practitioner and as supervising physician I was immediately available for consultation/collaboration.   Geoffery Lyons, MD 11/02/11 2021

## 2011-11-02 NOTE — ED Notes (Signed)
Multiple toothaches since last hs

## 2011-11-02 NOTE — ED Provider Notes (Signed)
History     CSN: [id number]  Arrival date & time 11/02/11  1552   First MD Initiated Contact with Patient 11/02/11 1558      Chief Complaint  Patient presents with  . Dental Pain    (Consider location/radiation/quality/duration/timing/severity/associated sxs/prior treatment) Patient is a 41 y.o. male presenting with tooth pain. The history is provided by the patient. No language interpreter was used.  Dental PainThe primary symptoms include mouth pain. Primary symptoms do not include dental injury, oral bleeding, oral lesions, headaches, fever, shortness of breath, sore throat or angioedema. The symptoms began yesterday. The symptoms are unchanged. The symptoms are new. The symptoms occur constantly.  Mouth pain began 12 - 24 hours ago. Mouth pain occurs constantly. Mouth pain is unchanged. Affected locations include: teeth and gum(s). The mouth pain is currently at 10/10.  Additional symptoms include: dental sensitivity to temperature, gum swelling and gum tenderness. Additional symptoms do not include: purulent gums, trismus, jaw pain, facial swelling, trouble swallowing, pain with swallowing, ear pain and swollen glands. Medical issues include: smoking and periodontal disease.    Past Medical History  Diagnosis Date  . Kidney stone     History reviewed. No pertinent past surgical history.  History reviewed. No pertinent family history.  History  Substance Use Topics  . Smoking status: Current Everyday Smoker -- 0.5 packs/day    Types: Cigarettes  . Smokeless tobacco: Not on file  . Alcohol Use: No      Review of Systems  Constitutional: Negative for fever and chills.  HENT: Positive for dental problem. Negative for ear pain, sore throat, facial swelling, trouble swallowing and neck pain.   Respiratory: Negative for shortness of breath.   Musculoskeletal: Negative for myalgias.  Skin: Negative.  Negative for rash.  Neurological: Negative for dizziness, weakness,  numbness and headaches.  Hematological: Does not bruise/bleed easily.  All other systems reviewed and are negative.    Allergies  Sulfa antibiotics  Home Medications   Current Outpatient Rx  Name Route Sig Dispense Refill  . IBUPROFEN 200 MG PO TABS Oral Take 400 mg by mouth every 6 (six) hours as needed. pain    . NAPROXEN 500 MG PO TABS Oral Take 500 mg by mouth 2 (two) times daily.      BP 116/78  Pulse 83  Temp(Src) 98.2 F (36.8 C) (Oral)  Resp 20  Ht 6' (1.829 m)  Wt 180 lb (81.647 kg)  BMI 24.41 kg/m2  SpO2 98%  Physical Exam  Nursing note and vitals reviewed. Constitutional: He is oriented to person, place, and time. He appears well-developed and well-nourished. No distress.  HENT:  Head: Normocephalic and atraumatic. No trismus in the jaw.  Mouth/Throat: Uvula is midline, oropharynx is clear and moist and mucous membranes are normal. No oral lesions. Dental caries present. No dental abscesses, uvula swelling or lacerations.    Neck: Normal range of motion. Neck supple.  Cardiovascular: Normal rate, regular rhythm and normal heart sounds.   No murmur heard. Pulmonary/Chest: Effort normal and breath sounds normal. No respiratory distress.  Musculoskeletal: He exhibits no tenderness.  Lymphadenopathy:    He has no cervical adenopathy.  Neurological: He is alert and oriented to person, place, and time. Coordination normal.  Skin: Skin is warm and dry.    ED Course  Procedures (including critical care time)       MDM     Patient has multiple dental caries without obvious abscess. Tenderness to palpation of the gums surrounding  the left lower second bicuspid and left upper central incisor. No facial swelling. No trismus.     Teshia Mahone L. [place], [place] 11/02/11 [phone]

## 2011-12-30 ENCOUNTER — Encounter (HOSPITAL_COMMUNITY): Payer: Self-pay | Admitting: *Deleted

## 2011-12-30 ENCOUNTER — Emergency Department (HOSPITAL_COMMUNITY)
Admission: EM | Admit: 2011-12-30 | Discharge: 2011-12-30 | Disposition: A | Discharge status: Home / Self Care | Payer: Self-pay | Attending: Emergency Medicine | Admitting: Emergency Medicine

## 2011-12-30 ENCOUNTER — Emergency Department (HOSPITAL_COMMUNITY): Payer: Self-pay

## 2011-12-30 DIAGNOSIS — R109 Unspecified abdominal pain: Secondary | ICD-10-CM | POA: Insufficient documentation

## 2011-12-30 DIAGNOSIS — R319 Hematuria, unspecified: Secondary | ICD-10-CM | POA: Insufficient documentation

## 2011-12-30 DIAGNOSIS — F17200 Nicotine dependence, unspecified, uncomplicated: Secondary | ICD-10-CM | POA: Insufficient documentation

## 2011-12-30 DIAGNOSIS — Z87442 Personal history of urinary calculi: Secondary | ICD-10-CM | POA: Insufficient documentation

## 2011-12-30 LAB — URINALYSIS, ROUTINE W REFLEX MICROSCOPIC
Bilirubin Urine: NEGATIVE
Ketones, ur: NEGATIVE mg/dL
Specific Gravity, Urine: 1.008 (ref 1.005–1.030)
pH: 7.5 (ref 5.0–8.0)

## 2011-12-30 LAB — URINE MICROSCOPIC-ADD ON

## 2011-12-30 MED ORDER — OXYCODONE-ACETAMINOPHEN 5-325 MG PO TABS: 2.0000 | ORAL_TABLET | ORAL | Status: AC | PRN

## 2011-12-30 MED ORDER — ONDANSETRON HCL 4 MG/2ML IJ SOLN
4.0000 mg | Freq: Once | INTRAMUSCULAR | Status: AC
  Administered 2011-12-30: 4 mg via INTRAVENOUS
  Filled 2011-12-30: qty 2

## 2011-12-30 MED ORDER — HYDROMORPHONE HCL PF 1 MG/ML IJ SOLN
1.0000 mg | Freq: Once | INTRAMUSCULAR | Status: AC
  Administered 2011-12-30: 1 mg via INTRAVENOUS
  Filled 2011-12-30: qty 1

## 2011-12-30 MED ORDER — SODIUM CHLORIDE 0.9 % IV SOLN
Freq: Once | INTRAVENOUS | Status: AC
  Administered 2011-12-30: 09:00:00 via INTRAVENOUS

## 2011-12-30 NOTE — ED Provider Notes (Signed)
History     CSN: [id number]  Arrival date & time 12/30/11  0736   First MD Initiated Contact with Patient 12/30/11 0749      No chief complaint on file.   (Consider location/radiation/quality/duration/timing/severity/associated sxs/prior treatment) The history is provided by the patient.   patient here with right-sided flank pain described as colicky. Symptoms are similar to his prior kidney stones. Notes nausea without vomiting. Denies any hematuria or dysuria. No fever. No medications taken prior to arrival. Nothing makes the symptoms better or worse. Denies any testicular pain.  Past Medical History  Diagnosis Date  . Kidney stone     No past surgical history on file.  No family history on file.  History  Substance Use Topics  . Smoking status: Current Everyday Smoker -- 0.5 packs/day    Types: Cigarettes  . Smokeless tobacco: Not on file  . Alcohol Use: No      Review of Systems  All other systems reviewed and are negative.    Allergies  Sulfa antibiotics  Home Medications   Current Outpatient Rx  Name Route Sig Dispense Refill  . IBUPROFEN 200 MG PO TABS Oral Take 400 mg by mouth every 6 (six) hours as needed. pain    . NAPROXEN 500 MG PO TABS Oral Take 500 mg by mouth 2 (two) times daily.      BP 127/75  Pulse 65  Temp(Src) 98.1 F (36.7 C) (Oral)  Resp 18  SpO2 100%  Physical Exam  Nursing note and vitals reviewed. Constitutional: He is oriented to person, place, and time. He appears well-developed and well-nourished.  Non-toxic appearance. No distress.  HENT:  Head: Normocephalic and atraumatic.  Eyes: Conjunctivae, EOM and lids are normal. Pupils are equal, round, and reactive to light.  Neck: Normal range of motion. Neck supple. No tracheal deviation present. No mass present.  Cardiovascular: Normal rate, regular rhythm and normal heart sounds.  Exam reveals no gallop.   No murmur heard. Pulmonary/Chest: Effort normal and breath sounds  normal. No stridor. No respiratory distress. He has no decreased breath sounds. He has no wheezes. He has no rhonchi. He has no rales.  Abdominal: Soft. Normal appearance and bowel sounds are normal. He exhibits no distension. There is no tenderness. There is CVA tenderness. There is no rigidity, no rebound and no guarding.  Musculoskeletal: Normal range of motion. He exhibits no edema and no tenderness.  Neurological: He is alert and oriented to person, place, and time. He has normal strength. No cranial nerve deficit or sensory deficit. GCS eye subscore is 4. GCS verbal subscore is 5. GCS motor subscore is 6.  Skin: Skin is warm and dry. No abrasion and no rash noted.  Psychiatric: He has a normal mood and affect. His speech is normal and behavior is normal.    ED Course  Procedures (including critical care time)  Labs Reviewed - No data to display No results found.   No diagnosis found.    MDM  Patient given IV fluids and pain medication. CT scan without signs of kidney stones. Patient does have hematuria and will be given a referral to see a urologist.        Toy Baker, MD 12/30/11 [phone]

## 2011-12-30 NOTE — Discharge Instructions (Signed)
Hematuria, Adult  Hematuria (blood in your urine) can be caused by a bladder infection (cystitis), kidney infection (pyelonephritis), prostate infection (prostatitis), or kidney stone. Infections will usually respond to antibiotics (medications which kill germs), and a kidney stone will usually pass through your urine without further treatment. If you were put on antibiotics, take all the medicine until gone. You may feel better in a few days, but take all of your medicine or the infection may not respond and become more difficult to treat. If antibiotics were not given, an infection did not cause the blood in the urine. A further work up to find out the reason may be needed.  HOME CARE INSTRUCTIONS   · Drink lots of fluid, 3 to 4 quarts a day. If you have been diagnosed with an infection, cranberry juice is especially recommended, in addition to large amounts of water.  · Avoid caffeine, tea, and carbonated beverages, because they tend to irritate the bladder.  · Avoid alcohol as it may irritate the prostate.  · Only take over-the-counter or prescription medicines for pain, discomfort, or fever as directed by your caregiver.  · If you have been diagnosed with a kidney stone follow your caregivers instructions regarding straining your urine to catch the stone.  TO PREVENT FURTHER INFECTIONS:  · Empty the bladder often. Avoid holding urine for long periods of time.  · After a bowel movement, women should cleanse front to back. Use each tissue only once.  · Empty the bladder before and after sexual intercourse if you are a male.  · Return to your caregiver if you develop back pain, fever, nausea (feeling sick to your stomach), vomiting, or your symptoms (problems) are not better in 3 days. Return sooner if you are getting worse.  If you have been requested to return for further testing make sure to keep your appointments. If an infection is not the cause of blood in your urine, X-rays may be required. Your caregiver  will discuss this with you.  SEEK IMMEDIATE MEDICAL CARE IF:   · You have a persistent fever over 102° F (38.9° C).  · You develop severe vomiting and are unable to keep the medication down.  · You develop severe back or abdominal pain despite taking your medications.  · You begin passing a large amount of blood or clots in your urine.  · You feel extremely weak or faint, or pass out.  MAKE SURE YOU:   · Understand these instructions.  · Will watch your condition.  · Will get help right away if you are not doing well or get worse.  Document Released: 09/09/2005 Document Revised: 08/29/2011 Document Reviewed: 04/28/2008  [employer]® Patient Information ©2012 [employer].

## 2012-01-20 ENCOUNTER — Ambulatory Visit (HOSPITAL_COMMUNITY): Admission: AD | Admit: 2012-01-20 | Payer: 59 | Source: Ambulatory Visit | Admitting: Psychiatry

## 2012-01-20 ENCOUNTER — Emergency Department (HOSPITAL_COMMUNITY)
Admission: EM | Admit: 2012-01-20 | Discharge: 2012-01-20 | Disposition: A | Discharge status: Home / Self Care | Payer: Self-pay | Attending: Emergency Medicine | Admitting: Emergency Medicine

## 2012-01-20 ENCOUNTER — Encounter (HOSPITAL_COMMUNITY): Payer: Self-pay | Admitting: *Deleted

## 2012-01-20 DIAGNOSIS — Z046 Encounter for general psychiatric examination, requested by authority: Secondary | ICD-10-CM | POA: Insufficient documentation

## 2012-01-20 DIAGNOSIS — F1910 Other psychoactive substance abuse, uncomplicated: Secondary | ICD-10-CM | POA: Insufficient documentation

## 2012-01-20 LAB — COMPREHENSIVE METABOLIC PANEL
Alkaline Phosphatase: 67 U/L (ref 39–117)
BUN: 9 mg/dL (ref 6–23)
CO2: 27 mEq/L (ref 19–32)
Chloride: 104 mEq/L (ref 96–112)
GFR calc Af Amer: >90 mL/min (ref >90)
GFR calc non Af Amer: >90 mL/min (ref >90)
Glucose, Bld: 115 mg/dL — ABNORMAL HIGH (ref 70–99)
Potassium: 4 mEq/L (ref 3.5–5.1)
Total Bilirubin: 0.2 mg/dL — ABNORMAL LOW (ref 0.3–1.2)

## 2012-01-20 LAB — CBC
HCT: 40.8 % (ref 39.0–52.0)
Hemoglobin: 13.6 g/dL (ref 13.0–17.0)
WBC: 9 10*3/uL (ref 4.0–10.5)

## 2012-01-20 LAB — ETHANOL: Alcohol, Ethyl (B): <11 mg/dL (ref 0–11)

## 2012-01-20 LAB — RAPID URINE DRUG SCREEN, HOSP PERFORMED: Amphetamines: NOT DETECTED

## 2012-01-20 MED ORDER — ADULT MULTIVITAMIN W/MINERALS CH
1.0000 | ORAL_TABLET | Freq: Every day | ORAL | Status: DC
  Administered 2012-01-20: 1 via ORAL
  Filled 2012-01-20: qty 1

## 2012-01-20 MED ORDER — NICOTINE 21 MG/24HR TD PT24
21.0000 mg | MEDICATED_PATCH | Freq: Every day | TRANSDERMAL | Status: DC
Start: 2012-01-20 — End: 2012-01-20
  Administered 2012-01-20: 21 mg via TRANSDERMAL
  Filled 2012-01-20: qty 1

## 2012-01-20 MED ORDER — LORAZEPAM 2 MG/ML IJ SOLN: 1.0000 mg | Freq: Four times a day (QID) | INTRAMUSCULAR | Status: DC | PRN

## 2012-01-20 MED ORDER — FOLIC ACID 1 MG PO TABS
1.0000 mg | ORAL_TABLET | Freq: Every day | ORAL | Status: DC
  Administered 2012-01-20: 1 mg via ORAL
  Filled 2012-01-20: qty 1

## 2012-01-20 MED ORDER — LORAZEPAM 1 MG PO TABS: 0.0000 mg | ORAL_TABLET | Freq: Two times a day (BID) | ORAL | Status: DC

## 2012-01-20 MED ORDER — LORAZEPAM 1 MG PO TABS: 0.0000 mg | ORAL_TABLET | Freq: Four times a day (QID) | ORAL | Status: DC

## 2012-01-20 MED ORDER — LORAZEPAM 1 MG PO TABS
1.0000 mg | ORAL_TABLET | Freq: Once | ORAL | Status: AC
  Administered 2012-01-20: 1 mg via ORAL

## 2012-01-20 MED ORDER — LORAZEPAM 1 MG PO TABS
1.0000 mg | ORAL_TABLET | Freq: Four times a day (QID) | ORAL | Status: DC | PRN
  Filled 2012-01-20: qty 1

## 2012-01-20 MED ORDER — VITAMIN B-1 100 MG PO TABS
100.0000 mg | ORAL_TABLET | Freq: Every day | ORAL | Status: DC
  Administered 2012-01-20: 100 mg via ORAL
  Filled 2012-01-20: qty 1

## 2012-01-20 MED ORDER — THIAMINE HCL 100 MG/ML IJ SOLN: 100.0000 mg | Freq: Every day | INTRAMUSCULAR | Status: DC

## 2012-01-20 NOTE — ED Notes (Signed)
Pt is currently taking about leaving and going to mental health tomorrow morning.

## 2012-01-20 NOTE — ED Notes (Signed)
Pt is upset because he cannot go outside to smoke a cigarette. I offered the pt a nicotine patch, but he refused. EDP notified.

## 2012-01-20 NOTE — Discharge Instructions (Signed)
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 Once you are on less than 40 mg a day, the detox programs are more available. You may also continue lowering your dose working with [facility].

## 2012-01-20 NOTE — Progress Notes (Signed)
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 [facility] refused. Suggested medical detox.  Which would require admission to the hospital. There is currently  no medical reason for admission of this patient. Recommend he return to the facility he is receiving methadone from and request a taper program. Orvilla Fus will speak again wthi [facility] 1800 Patient asking what options are. I advised that he would not be admitted for medical detox. Best option is to return to [facility] and allow them to lower and monitor dose of methadone. If he can get below 40 mg a day, dtox programs are more available. He opted for that option.

## 2012-01-20 NOTE — ED Notes (Signed)
Pt is asleep at this time.

## 2012-01-20 NOTE — ED Notes (Signed)
Pt given meal tray.

## 2012-01-20 NOTE — BH Assessment (Signed)
Assessment Note   Devin Hodge is an 41 y.o. male. Mr.Melnyk came to the ED today seeking detox from alcohol,opiates and methadone. He has been using since he was 41 years old. He has been in treatment once about 2 years ago. He was at  [facility]. He is not hallucinated nor is he delusional. He is not suicidal nor homicidal. He is alert and oriented x3. He appears to be motivated toward treatment by his current relationships.  Axis I:  Polysubstance Dependence;Substance induced mood diorder Axis II: Deferred Axis III:  Past Medical History  Diagnosis Date  . Kidney stone    Axis IV: economic problems, occupational problems, problems related to social environment, problems with access to health care services and problems with primary support group Axis V: 41-50 serious symptoms  Past Medical History:  Past Medical History  Diagnosis Date  . Kidney stone     Past Surgical History  Procedure Date  . Pleural scarification     Family History: History reviewed. No pertinent family history.  Social History:  reports that he has been smoking Cigarettes.  He has been smoking about .5 packs per day. He does not have any smokeless tobacco history on file. He reports that he drinks alcohol. He reports that he uses illicit drugs (IV).  Additional Social History:    Allergies:  Allergies  Allergen Reactions  . Sulfa Antibiotics Rash    Home Medications:  (Not in a hospital admission)  OB/GYN Status:  No LMP for male patient.  General Assessment Data Location of Assessment: [facility] ACT Assessment: Yes Living Arrangements: Spouse/significant other;Children Can pt return to current living arrangement?: Yes Admission Status: Voluntary Is patient capable of signing voluntary admission?: Yes Transfer from: Acute Hospital Referral Source: Self/Family/Friend  Education Status Is patient currently in school?: No  Risk to self Suicidal Ideation: No Suicidal Intent: No Is patient at risk  for suicide?: No Suicidal Plan?: No Access to Means: No What has been your use of drugs/alcohol within the last 12 months?: etoh,opiates,methodone Previous Attempts/Gestures: No Other Self Harm Risks: substance abuse Intentional Self Injurious Behavior: None Family Suicide History: No Recent stressful life event(s): Conflict (Comment);Turmoil (Comment);Financial Problems (conflict with girlfriend when using) Persecutory voices/beliefs?: No Depression: Yes Depression Symptoms: Insomnia;Guilt;Loss of interest in usual pleasures;Feeling worthless/self pity Substance abuse history and/or treatment for substance abuse?: Yes Suicide prevention information given to non-admitted patients: Yes  Risk to Others Homicidal Ideation: No Thoughts of Harm to Others: No Current Homicidal Intent: No Current Homicidal Plan: No Access to Homicidal Means: No History of harm to others?: No Assessment of Violence: None Noted Does patient have access to weapons?: No Criminal Charges Pending?: No Does patient have a court date: No  Psychosis Hallucinations: None noted Delusions: None noted  Mental Status Report Appear/Hygiene: Improved Eye Contact: Fair Motor Activity: Restlessness Speech: Pressured Level of Consciousness: Alert;Restless Mood: Depressed;Guilty Affect: Anxious;Blunted Anxiety Level: Moderate Thought Processes: Coherent;Relevant Judgement: Unimpaired Orientation: Person;Place;Time;Situation Obsessive Compulsive Thoughts/Behaviors: None  Cognitive Functioning Concentration: Normal Memory: Recent Intact;Remote Intact IQ: Average Insight: Poor Impulse Control: Poor Appetite: Fair Sleep: No Change Vegetative Symptoms: None  Prior Inpatient Therapy Prior Inpatient Therapy: Yes Prior Therapy Dates: 2011 Prior Therapy Facilty/Provider(s): [facility] Reason for Treatment: detox  Prior Outpatient Therapy Prior Outpatient Therapy: Yes Prior Therapy Dates: current Prior Therapy  Facilty/Provider(s): [facility] Reason for Treatment: methodone clinc  ADL Screening (condition at time of admission) Patient's cognitive ability adequate to safely complete daily activities?: Yes Patient able to  express need for assistance with ADLs?: Yes Independently performs ADLs?: Yes Weakness of Legs: None Weakness of Arms/Hands: None  Home Assistive Devices/Equipment Home Assistive Devices/Equipment: None      Values / Beliefs Cultural Requests During Hospitalization: None Spiritual Requests During Hospitalization: None   Advance Directives (For Healthcare) Advance Directive: Patient does not have advance directive;Patient would not like information    Additional Information 1:1 In Past 12 Months?: No CIRT Risk: No Elopement Risk: No Does patient have medical clearance?: Yes     Disposition: Patient referred to [facility], spoke with Olegario Messier.  She informed us that the daily use of Methadone through the [facility], was too high for [facility] and they could not accept the patient. Patient referred to [facility]. Disposition Disposition of Patient: Inpatient treatment program;Referred to Patient referred to: [facility];RTS  On Site Evaluation by:   Reviewed with Physician:     Jearld Pies 01/20/2012 3:50 PM

## 2012-01-20 NOTE — ED Provider Notes (Signed)
History   This chart was scribed for Glynn Octave, MD by Clarita Crane. The patient was seen in room [facility]03/[facility]03. Patient's care was started at 1344.    CSN: [id number]  Arrival date & time 01/20/12  1344   First MD Initiated Contact with Patient 01/20/12 1402      No chief complaint on file.   (Consider location/radiation/quality/duration/timing/severity/associated sxs/prior treatment) HPI Devin Hodge is a 41 y.o. male who presents to the Emergency Department requesting enrollment in a detox program for his abuse of alcohol, heroin, methadone and cocaine. Patient states he last used alcohol, cocaine and heroin yesterday. States he generally consumes about a fifth ( ) of Vodka every 2 days. Denies chest pain, SOB, nausea, vomiting, diarrhea, HI and SI. Patient is a current smoker.   Past Medical History  Diagnosis Date  . Kidney stone     Past Surgical History  Procedure Date  . Pleural scarification     History reviewed. No pertinent family history.  History  Substance Use Topics  . Smoking status: Current Everyday Smoker -- 0.5 packs/day    Types: Cigarettes  . Smokeless tobacco: Not on file  . Alcohol Use: Yes      Review of Systems A complete 10 system review of systems was obtained and all systems are negative except as noted in the HPI and PMH.   Allergies  Sulfa antibiotics  Home Medications   Current Outpatient Rx  Name Route Sig Dispense Refill  . IBUPROFEN 200 MG PO TABS Oral Take 400 mg by mouth every 6 (six) hours as needed. pain      BP 118/72  Pulse 63  Temp(Src) 97.9 F (36.6 C) (Oral)  Resp 16  Ht 6' (1.829 m)  Wt 170 lb (77.111 kg)  BMI 23.06 kg/m2  SpO2 100%  Physical Exam  Nursing note and vitals reviewed. Constitutional: He is oriented to person, place, and time. He appears well-developed and well-nourished. No distress.       Smells of cigarettes.   HENT:  Head: Normocephalic and atraumatic.  Eyes: EOM are normal.  Pupils are equal, round, and reactive to light.  Neck: Neck supple. No tracheal deviation present.  Cardiovascular: Normal rate and regular rhythm.  Exam reveals no gallop and no friction rub.   No murmur heard. Pulmonary/Chest: Effort normal. No respiratory distress. He has no wheezes. He has no rales.  Abdominal: Soft. He exhibits no distension. There is no tenderness.  Musculoskeletal: Normal range of motion. He exhibits no edema.  Neurological: He is alert and oriented to person, place, and time. No sensory deficit.  Skin: Skin is warm and dry.  Psychiatric: He has a normal mood and affect. His behavior is normal.    ED Course  Procedures (including critical care time)  DIAGNOSTIC STUDIES: Oxygen Saturation is 99% on room air, normal by my interpretation.    COORDINATION OF CARE: 2:10PM-Patient informed of current plan for treatment and evaluation and agrees with plan at this time. Will consult with ACT team.     Labs Reviewed  URINE RAPID DRUG SCREEN (HOSP PERFORMED) - Abnormal; Notable for the following:    Cocaine POSITIVE (*)    Tetrahydrocannabinol POSITIVE (*)    All other components within normal limits  CBC  COMPREHENSIVE METABOLIC PANEL  ETHANOL   No results found.   No diagnosis found.    MDM  Polysubstance abuse including alcohol, methadone, heroin, cocaine. Complains of nausea. No chest pain no abdominal pain. No tremors or  shakes. No suicidal or homicidal ideation. Alcohol withdrawal precautions.  Screening labs, discuss with act team for resources      I personally performed the services described in this documentation, which was scribed in my presence.  The recorded information has been reviewed and considered.    Glynn Octave, MD 01/20/12 [phone]

## 2012-01-20 NOTE — ED Notes (Addendum)
After dr. Colon Branch left room, patient walked out, stated he was leaving, and refused to sign paperwork. Dr. Colon Branch notified.

## 2012-01-20 NOTE — Progress Notes (Signed)
Spoke with Dr Ferol Luz who accepted pt to dr Rexene Edison. Pt refused treatment and left er [facility]

## 2012-01-20 NOTE — ED Notes (Signed)
Dr. Colon Branch in talking with patient at this time.

## 2012-01-20 NOTE — ED Notes (Signed)
Wants detox from etoh, methadone,heroin.  Etoh - 2 beers   Yesterday.Did heroin last night. Heroin was injected.

## 2012-01-20 NOTE — ED Notes (Signed)
ACT team evaluating pt at this time

## 2012-03-15 ENCOUNTER — Encounter (HOSPITAL_COMMUNITY): Payer: Self-pay | Admitting: Emergency Medicine

## 2012-03-15 ENCOUNTER — Emergency Department (HOSPITAL_COMMUNITY)
Admission: EM | Admit: 2012-03-15 | Discharge: 2012-03-15 | Disposition: A | Discharge status: Home / Self Care | Payer: Self-pay | Attending: Emergency Medicine | Admitting: Emergency Medicine

## 2012-03-15 DIAGNOSIS — K0889 Other specified disorders of teeth and supporting structures: Secondary | ICD-10-CM

## 2012-03-15 DIAGNOSIS — F17200 Nicotine dependence, unspecified, uncomplicated: Secondary | ICD-10-CM | POA: Insufficient documentation

## 2012-03-15 DIAGNOSIS — K089 Disorder of teeth and supporting structures, unspecified: Secondary | ICD-10-CM | POA: Insufficient documentation

## 2012-03-15 DIAGNOSIS — Z87442 Personal history of urinary calculi: Secondary | ICD-10-CM | POA: Insufficient documentation

## 2012-03-15 MED ORDER — ACETAMINOPHEN 500 MG PO TABS
1000.0000 mg | ORAL_TABLET | Freq: Once | ORAL | Status: AC
  Administered 2012-03-15: 1000 mg via ORAL
  Filled 2012-03-15: qty 2

## 2012-03-15 MED ORDER — IBUPROFEN 800 MG PO TABS: 800.0000 mg | ORAL_TABLET | Freq: Three times a day (TID) | ORAL | Status: AC

## 2012-03-15 MED ORDER — AMOXICILLIN 500 MG PO CAPS: 500.0000 mg | ORAL_CAPSULE | Freq: Three times a day (TID) | ORAL | Status: AC

## 2012-03-15 MED ORDER — IBUPROFEN 800 MG PO TABS
800.0000 mg | ORAL_TABLET | Freq: Once | ORAL | Status: AC
  Administered 2012-03-15: 800 mg via ORAL
  Filled 2012-03-15: qty 1

## 2012-03-15 MED ORDER — PENICILLIN V POTASSIUM 250 MG PO TABS
500.0000 mg | ORAL_TABLET | Freq: Once | ORAL | Status: AC
  Administered 2012-03-15: 500 mg via ORAL
  Filled 2012-03-15: qty 2

## 2012-03-15 NOTE — Discharge Instructions (Signed)
Please see a dentist as soon as possible. Amoxil three times daily with food. Use Ibuprofen three times daily with food. Use Tylenol extra strength in between the ibuprofen if needed.Toothache Toothaches are usually caused by tooth decay (cavity). However, other causes of toothache include:  Gum disease.   Cracked tooth.   Cracked filling.   Injury.   Jaw problem (temporo mandibular joint or TMJ disorder).   Tooth abscess.   Root sensitivity.   Grinding.   Eruption problems.  Swelling and redness around a painful tooth often means you have a dental abscess. Pain medicine and antibiotics can help reduce symptoms, but you will need to see a dentist within the next few days to have your problem properly evaluated and treated. If tooth decay is the problem, you may need a filling or root canal to save your tooth. If the problem is more severe, your tooth may need to be pulled. SEEK IMMEDIATE MEDICAL CARE IF:  You cannot swallow.   You develop severe swelling, increased redness, or increased pain in your mouth or face.   You have a fever.   You cannot open your mouth adequately.  Document Released: 10/17/2004 Document Revised: 08/29/2011 Document Reviewed: 12/07/2009 [facility] Patient Information 2012 [place], [place].

## 2012-03-15 NOTE — ED Provider Notes (Signed)
History     CSN: [id number]  Arrival date & time 03/15/12  1602   First MD Initiated Contact with Patient 03/15/12 1700      Chief Complaint  Patient presents with  . Dental Pain    (Consider location/radiation/quality/duration/timing/severity/associated sxs/prior treatment) Patient is a 41 y.o. male presenting with tooth pain. The history is provided by the patient.  Dental PainThe primary symptoms include mouth pain and headaches. Primary symptoms do not include shortness of breath or cough. The symptoms began yesterday. The symptoms are worsening. The symptoms occur constantly.  The headache is not associated with photophobia.  Additional symptoms include: dental sensitivity to temperature, gum swelling and jaw pain. Additional symptoms do not include: nosebleeds. Medical issues include: alcohol problem and smoking. Associated medical issues comments: substance abuse.    Past Medical History  Diagnosis Date  . Kidney stone     Past Surgical History  Procedure Date  . Pleural scarification     No family history on file.  History  Substance Use Topics  . Smoking status: Current Everyday Smoker -- 0.5 packs/day    Types: Cigarettes  . Smokeless tobacco: Not on file  . Alcohol Use: Yes      Review of Systems  Constitutional: Negative for activity change.       All ROS Neg except as noted in HPI  HENT: Negative for nosebleeds and neck pain.   Eyes: Negative for photophobia and discharge.  Respiratory: Negative for cough, shortness of breath and wheezing.   Cardiovascular: Negative for chest pain and palpitations.  Gastrointestinal: Negative for abdominal pain and blood in stool.  Genitourinary: Negative for dysuria, frequency and hematuria.  Musculoskeletal: Negative for back pain and arthralgias.  Skin: Negative.   Neurological: Positive for headaches. Negative for dizziness, seizures and speech difficulty.  Psychiatric/Behavioral: Negative for hallucinations and  confusion.    Allergies  Sulfa antibiotics  Home Medications   Current Outpatient Rx  Name Route Sig Dispense Refill  . AMOXICILLIN 500 MG PO CAPS Oral Take 1 capsule (500 mg total) by mouth 3 (three) times daily. 21 capsule 0  . IBUPROFEN 800 MG PO TABS Oral Take 1 tablet (800 mg total) by mouth 3 (three) times daily. 21 tablet 0    BP 115/69  Pulse 79  Temp 98.6 F (37 C) (Oral)  Resp 16  Ht 6' (1.829 m)  Wt 170 lb (77.111 kg)  BMI 23.06 kg/m2  SpO2 98%  Physical Exam  Nursing note and vitals reviewed. Constitutional: He is oriented to person, place, and time. He appears well-developed and well-nourished.  Non-toxic appearance.  HENT:  Head: Normocephalic.  Right Ear: Tympanic membrane and external ear normal.  Left Ear: Tympanic membrane and external ear normal.       Deep cavity of the right upper canine. No visible abscess. Mod swelling around the gum. Multiple other dental cavities. Airway patent. Speech clear.  Eyes: EOM and lids are normal. Pupils are equal, round, and reactive to light.  Neck: Normal range of motion. Neck supple. Carotid bruit is not present.  Cardiovascular: Normal rate, regular rhythm, normal heart sounds, intact distal pulses and normal pulses.   Pulmonary/Chest: Breath sounds normal. No respiratory distress.  Abdominal: Soft. Bowel sounds are normal. There is no tenderness. There is no guarding.  Musculoskeletal: Normal range of motion.  Lymphadenopathy:       Head (right side): No submandibular adenopathy present.       Head (left side): No submandibular adenopathy present.  He has no cervical adenopathy.  Neurological: He is alert and oriented to person, place, and time. He has normal strength. No cranial nerve deficit or sensory deficit.  Skin: Skin is warm and dry.  Psychiatric: He has a normal mood and affect. His speech is normal.    ED Course  Procedures (including critical care time)  Labs Reviewed - No data to display No  results found.   1. Toothache       MDM  I have reviewed nursing notes, vital signs, and all appropriate lab and imaging results for this patient. Pt strongly advised to see a dentist as soon as possible.  Rx for amoxil and ibuprofen given to the patient. Dental resources given to patient.       Kathie Dike, [place] 03/15/12 1726

## 2012-03-15 NOTE — ED Notes (Signed)
Pt states "this shit hurts, he could have numbed it or something". Offered to ask PA if he could numb it but pt walked out and refused to sign for papers.

## 2012-03-15 NOTE — ED Notes (Signed)
Right upper toothache since yesterday.  

## 2012-03-19 ENCOUNTER — Emergency Department (HOSPITAL_COMMUNITY)
Admission: EM | Admit: 2012-03-19 | Discharge: 2012-03-19 | Disposition: A | Discharge status: Home / Self Care | Payer: Self-pay | Attending: Emergency Medicine | Admitting: Emergency Medicine

## 2012-03-19 ENCOUNTER — Encounter (HOSPITAL_COMMUNITY): Payer: Self-pay | Admitting: *Deleted

## 2012-03-19 ENCOUNTER — Emergency Department (HOSPITAL_COMMUNITY): Payer: Self-pay

## 2012-03-19 ENCOUNTER — Other Ambulatory Visit (HOSPITAL_COMMUNITY): Payer: Self-pay

## 2012-03-19 DIAGNOSIS — R10813 Right lower quadrant abdominal tenderness: Secondary | ICD-10-CM | POA: Insufficient documentation

## 2012-03-19 DIAGNOSIS — N50811 Right testicular pain: Secondary | ICD-10-CM

## 2012-03-19 DIAGNOSIS — N509 Disorder of male genital organs, unspecified: Secondary | ICD-10-CM | POA: Insufficient documentation

## 2012-03-19 LAB — URINALYSIS, ROUTINE W REFLEX MICROSCOPIC
Glucose, UA: NEGATIVE mg/dL
Leukocytes, UA: NEGATIVE
Protein, ur: NEGATIVE mg/dL

## 2012-03-19 LAB — URINE MICROSCOPIC-ADD ON

## 2012-03-19 MED ORDER — ONDANSETRON 8 MG PO TBDP
8.0000 mg | ORAL_TABLET | Freq: Once | ORAL | Status: AC
  Administered 2012-03-19: 8 mg via ORAL
  Filled 2012-03-19: qty 1

## 2012-03-19 MED ORDER — MORPHINE SULFATE 4 MG/ML IJ SOLN
4.0000 mg | Freq: Once | INTRAMUSCULAR | Status: AC
  Administered 2012-03-19: 4 mg via INTRAMUSCULAR
  Filled 2012-03-19: qty 1

## 2012-03-19 MED ORDER — DOXYCYCLINE HYCLATE 100 MG PO TABS: 100.0000 mg | ORAL_TABLET | Freq: Two times a day (BID) | ORAL | Status: AC

## 2012-03-19 MED ORDER — TRAMADOL-ACETAMINOPHEN 37.5-325 MG PO TABS: ORAL_TABLET | ORAL | Status: AC

## 2012-03-19 NOTE — ED Notes (Signed)
Pt c/o pain in his right testicle since last night. States that the pain is radiating into his lower abdomen. Pt denies injury or urinary symptoms.

## 2012-03-19 NOTE — ED Provider Notes (Signed)
History  This chart was scribed for [name], MD by Bennett Scrape. This patient was seen in room [facility]03[facility]03 and the patient's care was started at 10:39AM.  CSN: [id number]  Arrival date & time 03/19/12  1011   First MD Initiated Contact with Patient 03/19/12 1039      Chief Complaint  Patient presents with  . Testicle Pain    Patient is a 41 y.o. male presenting with testicular pain. The history is provided by the patient. No language interpreter was used.  Testicle Pain This is a new problem. The current episode started 12 to 24 hours ago. The problem occurs constantly. The problem has been gradually worsening. The symptoms are aggravated by walking. Nothing relieves the symptoms. He has tried nothing for the symptoms.    Devin Hodge is a 41 y.o. male who presents to the Emergency Department complaining of 14 hours of gradual onset, gradually worsening, constant right testicular pain that radiates into the right lower lateral abdomen with associated nausea and 2 episodes of non-bloody emesis. He denies having any emesis episodes this morning. He describes the pain as feeling like he has been punched in the testicles. 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 He denies injury. He denies having prior episodes of similar symptoms. He denies taking OTC medications at home to improve symptoms. He denies having trouble urinating and denies changes in his BMs. He denies abdominal swelling, chest pain, dysuria, hematuria, fever, diarrhea, back pain and rash as associated symptoms. He has a h/o kidney stones and states that the pain was more in his back. He is a current everyday smoker and occasional alcohol user.   Pt just had a CT scan in April that was negative for kidney stones.   No PCP.  Past Medical History  Diagnosis Date  . Kidney stone     Past Surgical History  Procedure Date    . Pleural scarification     History reviewed. No pertinent family history.  History  Substance Use Topics  . Smoking status: Current Everyday Smoker -- 1.0 packs/day    Types: Cigarettes  . Smokeless tobacco: Not on file  . Alcohol Use: Yes   employed   Review of Systems  All other systems reviewed and are negative.    Allergies  Sulfa antibiotics  Home Medications   Current Outpatient Rx  Name Route Sig Dispense Refill  . AMOXICILLIN 500 MG PO CAPS Oral Take 1 capsule (500 mg total) by mouth 3 (three) times daily. 21 capsule 0  . IBUPROFEN 800 MG PO TABS Oral Take 1 tablet (800 mg total) by mouth 3 (three) times daily. 21 tablet 0    Triage Vitals: BP 123/75  Pulse 63  Temp 98.2 F (36.8 C) (Oral)  Resp 18  Ht 6' (1.829 m)  Wt 170 lb (77.111 kg)  BMI 23.06 kg/m2  SpO2 100%  Vital signs normal    Physical Exam  Nursing note and vitals reviewed. Constitutional: He is oriented to person, place, and time. He appears well-developed and well-nourished. No distress.  HENT:  Head: Normocephalic and atraumatic.  Right Ear: External ear normal.  Left Ear: External ear normal.  Mouth/Throat: Oropharynx is clear and moist.  Eyes: Conjunctivae and EOM are normal. Pupils are equal, round, and reactive to light.  Neck: Normal range of motion. Neck supple. No tracheal deviation present.  Pulmonary/Chest: Effort normal and breath sounds normal. No respiratory distress.  Abdominal: Soft. He exhibits no distension. There is tenderness (right lower quadrant). There is no rebound and no guarding.  Genitourinary:       Both testicles had a normal appearance, mild tenderness of the right testicle, did not feel enlarged,  no hernias, no flank tenderness  Musculoskeletal: Normal range of motion. He exhibits no edema and no tenderness.       Pt moves all extremities well  Neurological: He is alert and oriented to person, place, and time. No cranial nerve deficit.  Skin: Skin is  warm and dry.  Psychiatric: He has a normal mood and affect. His behavior is normal.    ED Course  Procedures (including critical care time)   Medications  morphine 4 MG/ML injection 4 mg (4 mg Intramuscular Given 03/19/12 1057)  ondansetron (ZOFRAN-ODT) disintegrating tablet 8 mg (8 mg Oral Given 03/19/12 1057)   Patient had CT of his abdomen pelvis done in April which showed no renal stones. This is patients 6th ED visit in 6 months the most recent was 6/23 for dental pain. PT has no fever, no nausea, or vomiting, no fecalith on prior CT to suggest appy.   DIAGNOSTIC STUDIES: Oxygen Saturation is 100% on room air, normal by my interpretation.    COORDINATION OF CARE:  10:54AM-Discussed treatment plan with pt and pt agreed to plan.  12:40PM-Pt rechecked and feels better. Informed pt of [place] and lab work results. Discussed discharge plan with pt and pt agreed to plan. Pt has ? Early epididymitis from straining yesterday. NO hernia on exam.   Review of [place] controlled substance site shows patient has received 4 prescriptions for narcotics in February, one in January, and one in April.   Results for orders placed during the hospital encounter of 03/19/12  URINALYSIS, ROUTINE W REFLEX MICROSCOPIC      Component Value Range   Color, Urine YELLOW  YELLOW   APPearance CLEAR  CLEAR   Specific Gravity, Urine 1.005  1.005 - 1.030   pH 6.0  5.0 - 8.0   Glucose, UA NEGATIVE  NEGATIVE mg/dL   Hgb urine dipstick MODERATE (*) NEGATIVE   Bilirubin Urine NEGATIVE  NEGATIVE   Ketones, ur NEGATIVE  NEGATIVE mg/dL   Protein, ur NEGATIVE  NEGATIVE mg/dL   Urobilinogen, UA 0.2  0.0 - 1.0 mg/dL   Nitrite NEGATIVE  NEGATIVE   Leukocytes, UA NEGATIVE  NEGATIVE  URINE MICROSCOPIC-ADD ON      Component Value Range   RBC / HPF 7-10  <3 RBC/hpf   Bacteria, UA RARE  RARE   Laboratory interpretation all normal except hematuria (mild)   US Scrotum  03/19/2012  *RADIOLOGY REPORT*  Clinical  Data: Right-sided testicular pain.  Evaluate for torsion.  SCROTAL ULTRASOUND DOPPLER ULTRASOUND OF THE TESTICLES  Technique:  Complete ultrasound examination of the testicles, epididymis, and other scrotal structures was performed.  Color and spectral Doppler ultrasound were also utilized to evaluate blood flow to the testicles.  Comparison:  No priors.  Findings:  The testicles are symmetric in size and echogenicity. No testicular masses are seen, and there is no evidence of microlithiasis.  Both epididymal heads are unremarkable in appearance.  Small bilateral hydroceles are present. There is otherwise no evidence of varicocele, or other extra-testicular abnormality.  Blood flow is seen within both testicles on color Doppler sonography.  Doppler spectral waveforms show both arterial and venous flow signal in both testicles.  IMPRESSION: 1. No evidence of testicular mass or torsion. 2.  Small bilateral hydroceles are present.  Original Report Authenticated By: Florencia Reasons, M.D.   [place] Art/ven Flow Abd Pelv Doppler  03/19/2012  *RADIOLOGY REPORT*  Clinical Data: Right-sided testicular pain.  Evaluate for torsion.  SCROTAL ULTRASOUND DOPPLER ULTRASOUND OF THE TESTICLES  Technique:  Complete ultrasound examination of the testicles, epididymis, and other scrotal structures was performed.  Color and spectral Doppler ultrasound were also utilized to evaluate blood flow to the testicles.  Comparison:  No priors.  Findings:  The testicles are symmetric in size and echogenicity. No testicular masses are seen, and there is no evidence of microlithiasis.  Both epididymal heads are unremarkable in appearance.  Small bilateral hydroceles are present. There is otherwise no evidence of varicocele, or other extra-testicular abnormality.  Blood flow is seen within both testicles on color Doppler sonography.  Doppler spectral waveforms show both arterial and venous flow signal in both testicles.  IMPRESSION: 1. No evidence  of testicular mass or torsion. 2.  Small bilateral hydroceles are present.  Original Report Authenticated By: Florencia Reasons, M.D.     1. Testicular pain, right    Patient's Medications  New Prescriptions   DOXYCYCLINE (VIBRA-TABS) 100 MG TABLET    Take 1 tablet (100 mg total) by mouth 2 (two) times daily.   TRAMADOL-ACETAMINOPHEN (ULTRACET) 37.5-325 MG PER TABLET    2 tabs po QID prn pain    Plan discharge  Devoria Albe, MD, FACEP    MDM   I personally performed the services described in this documentation, which was scribed in my presence. The recorded information has been reviewed and considered.  Devoria Albe, MD, Armando Gang      [name], MD 03/19/12 [phone]

## 2012-03-19 NOTE — Discharge Instructions (Signed)
Drink plenty of fluids. Take the antibiotic until gone. Take the ultracet with ibuprofen for pain. You can be rechecked by Dr Jerre Simon if you continue to have pain. Return to the ED if you get fever, or your testicle gets swollen.

## 2012-03-19 NOTE — ED Provider Notes (Signed)
Medical screening examination/treatment/procedure(s) were performed by non-physician practitioner and as supervising physician I was immediately available for consultation/collaboration.  [name] M Hiroshi Krummel, MD 03/19/12 0108 

## 2012-04-28 ENCOUNTER — Encounter (HOSPITAL_COMMUNITY): Payer: Self-pay | Admitting: *Deleted

## 2012-04-28 ENCOUNTER — Emergency Department (HOSPITAL_COMMUNITY): Payer: Self-pay

## 2012-04-28 ENCOUNTER — Emergency Department (HOSPITAL_COMMUNITY)
Admission: EM | Admit: 2012-04-28 | Discharge: 2012-04-28 | Disposition: A | Discharge status: Home / Self Care | Payer: Self-pay | Attending: Emergency Medicine | Admitting: Emergency Medicine

## 2012-04-28 DIAGNOSIS — M79609 Pain in unspecified limb: Secondary | ICD-10-CM | POA: Insufficient documentation

## 2012-04-28 DIAGNOSIS — R51 Headache: Secondary | ICD-10-CM | POA: Insufficient documentation

## 2012-04-28 DIAGNOSIS — S0003XA Contusion of scalp, initial encounter: Secondary | ICD-10-CM | POA: Insufficient documentation

## 2012-04-28 DIAGNOSIS — R42 Dizziness and giddiness: Secondary | ICD-10-CM | POA: Insufficient documentation

## 2012-04-28 DIAGNOSIS — S022XXA Fracture of nasal bones, initial encounter for closed fracture: Secondary | ICD-10-CM

## 2012-04-28 DIAGNOSIS — S8010XA Contusion of unspecified lower leg, initial encounter: Secondary | ICD-10-CM

## 2012-04-28 DIAGNOSIS — F17200 Nicotine dependence, unspecified, uncomplicated: Secondary | ICD-10-CM | POA: Insufficient documentation

## 2012-04-28 DIAGNOSIS — S0083XA Contusion of other part of head, initial encounter: Secondary | ICD-10-CM

## 2012-04-28 DIAGNOSIS — S82839A Other fracture of upper and lower end of unspecified fibula, initial encounter for closed fracture: Secondary | ICD-10-CM

## 2012-04-28 MED ORDER — HYDROCODONE-ACETAMINOPHEN 5-325 MG PO TABS
2.0000 | ORAL_TABLET | Freq: Once | ORAL | Status: AC
  Administered 2012-04-28: 2 via ORAL
  Filled 2012-04-28: qty 2

## 2012-04-28 MED ORDER — HYDROCODONE-ACETAMINOPHEN 5-325 MG PO TABS: ORAL_TABLET | ORAL | Status: DC

## 2012-04-28 MED ORDER — IBUPROFEN 800 MG PO TABS
800.0000 mg | ORAL_TABLET | Freq: Once | ORAL | Status: AC
  Administered 2012-04-28: 800 mg via ORAL
  Filled 2012-04-28: qty 1

## 2012-04-28 NOTE — ED Notes (Signed)
Patient provided food and drink per request with Dr Delford Field permission.

## 2012-04-28 NOTE — ED Notes (Signed)
Patient with no complaints at this time. Respirations even and unlabored. Skin warm/dry. Discharge instructions reviewed with patient at this time. Patient given opportunity to voice concerns/ask questions. Patient discharged at this time and left Emergency Department with steady gait.   
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
## 2012-04-28 NOTE — ED Notes (Signed)
Pt to CT
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
## 2012-04-29 ENCOUNTER — Emergency Department (HOSPITAL_COMMUNITY)
Admission: EM | Admit: 2012-04-29 | Discharge: 2012-04-29 | Disposition: A | Discharge status: Home / Self Care | Payer: Self-pay | Attending: Orthopaedic Surgery | Admitting: Orthopaedic Surgery

## 2012-04-29 ENCOUNTER — Ambulatory Visit (HOSPITAL_COMMUNITY)
Admission: RE | Admit: 2012-04-29 | Discharge: 2012-04-29 | Disposition: A | Discharge status: Home / Self Care | Payer: Self-pay | Source: Ambulatory Visit | Attending: Orthopaedic Surgery | Admitting: Orthopaedic Surgery

## 2012-04-29 ENCOUNTER — Encounter (HOSPITAL_COMMUNITY): Payer: Self-pay | Admitting: *Deleted

## 2012-04-29 ENCOUNTER — Other Ambulatory Visit (HOSPITAL_COMMUNITY): Payer: Self-pay | Admitting: Orthopaedic Surgery

## 2012-04-29 DIAGNOSIS — S82839A Other fracture of upper and lower end of unspecified fibula, initial encounter for closed fracture: Secondary | ICD-10-CM | POA: Insufficient documentation

## 2012-04-29 DIAGNOSIS — Y9364 Activity, baseball: Secondary | ICD-10-CM | POA: Insufficient documentation

## 2012-04-29 DIAGNOSIS — T148XXA Other injury of unspecified body region, initial encounter: Secondary | ICD-10-CM

## 2012-04-29 DIAGNOSIS — M79609 Pain in unspecified limb: Secondary | ICD-10-CM | POA: Insufficient documentation

## 2012-04-29 DIAGNOSIS — S1093XA Contusion of unspecified part of neck, initial encounter: Secondary | ICD-10-CM | POA: Insufficient documentation

## 2012-04-29 DIAGNOSIS — Y9289 Other specified places as the place of occurrence of the external cause: Secondary | ICD-10-CM | POA: Insufficient documentation

## 2012-04-29 DIAGNOSIS — W219XXA Striking against or struck by unspecified sports equipment, initial encounter: Secondary | ICD-10-CM | POA: Insufficient documentation

## 2012-04-29 DIAGNOSIS — S82832A Other fracture of upper and lower end of left fibula, initial encounter for closed fracture: Secondary | ICD-10-CM

## 2012-04-29 DIAGNOSIS — S0003XA Contusion of scalp, initial encounter: Secondary | ICD-10-CM | POA: Insufficient documentation

## 2012-04-29 MED ORDER — OXYCODONE-ACETAMINOPHEN 5-325 MG PO TABS: 1.0000 | ORAL_TABLET | ORAL | Status: AC | PRN
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
## 2012-04-30 ENCOUNTER — Ambulatory Visit (INDEPENDENT_AMBULATORY_CARE_PROVIDER_SITE_OTHER): Payer: 59 | Admitting: Otolaryngology

## 2012-04-30 DIAGNOSIS — S022XXA Fracture of nasal bones, initial encounter for closed fracture: Secondary | ICD-10-CM

## 2012-05-02 ENCOUNTER — Emergency Department (HOSPITAL_COMMUNITY)
Admission: EM | Admit: 2012-05-02 | Discharge: 2012-05-02 | Disposition: A | Discharge status: Home / Self Care | Payer: Self-pay | Attending: Emergency Medicine | Admitting: Emergency Medicine

## 2012-05-02 ENCOUNTER — Encounter (HOSPITAL_COMMUNITY): Payer: Self-pay | Admitting: *Deleted

## 2012-05-02 DIAGNOSIS — Z882 Allergy status to sulfonamides status: Secondary | ICD-10-CM | POA: Insufficient documentation

## 2012-05-02 DIAGNOSIS — F17200 Nicotine dependence, unspecified, uncomplicated: Secondary | ICD-10-CM | POA: Insufficient documentation

## 2012-05-02 DIAGNOSIS — F1190 Opioid use, unspecified, uncomplicated: Secondary | ICD-10-CM

## 2012-05-02 DIAGNOSIS — F1120 Opioid dependence, uncomplicated: Secondary | ICD-10-CM | POA: Insufficient documentation

## 2012-05-02 DIAGNOSIS — M25569 Pain in unspecified knee: Secondary | ICD-10-CM | POA: Insufficient documentation

## 2012-05-02 DIAGNOSIS — G8929 Other chronic pain: Secondary | ICD-10-CM | POA: Insufficient documentation

## 2012-05-02 MED ORDER — OXYCODONE-ACETAMINOPHEN 5-325 MG PO TABS
1.0000 | ORAL_TABLET | Freq: Once | ORAL | Status: AC
  Administered 2012-05-02: 1 via ORAL
  Filled 2012-05-02: qty 1

## 2012-05-02 NOTE — ED Notes (Signed)
Asked pt about any type of brace given, pt states " I have one but I have to walk and don't use it some time", pt does not have any brace on in er today
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
## 2012-05-02 NOTE — Discharge Instructions (Signed)
Call dr. Hilda Lias.  He may be willing to prescribe more narcotic pain medicine for you.
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
## 2012-05-05 ENCOUNTER — Other Ambulatory Visit (HOSPITAL_COMMUNITY): Payer: Self-pay | Admitting: Orthopaedic Surgery

## 2012-05-05 ENCOUNTER — Ambulatory Visit (HOSPITAL_COMMUNITY)
Admission: RE | Admit: 2012-05-05 | Discharge: 2012-05-05 | Disposition: A | Discharge status: Home / Self Care | Payer: Self-pay | Source: Ambulatory Visit | Attending: Orthopaedic Surgery | Admitting: Orthopaedic Surgery

## 2012-05-05 DIAGNOSIS — T148XXA Other injury of unspecified body region, initial encounter: Secondary | ICD-10-CM

## 2012-05-05 DIAGNOSIS — Z4789 Encounter for other orthopedic aftercare: Secondary | ICD-10-CM | POA: Insufficient documentation

## 2012-05-05 DIAGNOSIS — M25569 Pain in unspecified knee: Secondary | ICD-10-CM | POA: Insufficient documentation

## 2012-05-11 NOTE — ED Provider Notes (Signed)
Medical screening examination/treatment/procedure(s) were performed by non-physician practitioner and as supervising physician I was immediately available for consultation/collaboration.  Donnetta Hutching, MD 05/11/12 1945

## 2012-05-14 ENCOUNTER — Emergency Department (HOSPITAL_COMMUNITY)
Admission: EM | Admit: 2012-05-14 | Discharge: 2012-05-15 | Disposition: A | Discharge status: Home / Self Care | Payer: Self-pay | Attending: Emergency Medicine | Admitting: Emergency Medicine

## 2012-05-14 ENCOUNTER — Encounter (HOSPITAL_COMMUNITY): Payer: Self-pay | Admitting: *Deleted

## 2012-05-14 DIAGNOSIS — F17200 Nicotine dependence, unspecified, uncomplicated: Secondary | ICD-10-CM | POA: Insufficient documentation

## 2012-05-14 DIAGNOSIS — F1910 Other psychoactive substance abuse, uncomplicated: Secondary | ICD-10-CM

## 2012-05-14 DIAGNOSIS — F1020 Alcohol dependence, uncomplicated: Secondary | ICD-10-CM | POA: Insufficient documentation

## 2012-05-14 DIAGNOSIS — F1420 Cocaine dependence, uncomplicated: Secondary | ICD-10-CM | POA: Insufficient documentation

## 2012-05-14 DIAGNOSIS — Z882 Allergy status to sulfonamides status: Secondary | ICD-10-CM | POA: Insufficient documentation

## 2012-05-14 HISTORY — DX: Encounter for follow-up examination after completed treatment for conditions other than malignant neoplasm: Z09

## 2012-05-14 HISTORY — DX: Personal history of other medical treatment: Z92.89

## 2012-05-14 LAB — BASIC METABOLIC PANEL
CO2: 20 mEq/L (ref 19–32)
Chloride: 105 mEq/L (ref 96–112)
Creatinine, Ser: 1.05 mg/dL (ref 0.50–1.35)
Glucose, Bld: 110 mg/dL — ABNORMAL HIGH (ref 70–99)
Sodium: 136 mEq/L (ref 135–145)

## 2012-05-14 LAB — CBC WITH DIFFERENTIAL/PLATELET
Basophils Absolute: 0 10*3/uL (ref 0.0–0.1)
HCT: 32.6 % — ABNORMAL LOW (ref 39.0–52.0)
Hemoglobin: 11.3 g/dL — ABNORMAL LOW (ref 13.0–17.0)
Lymphocytes Relative: 33 % (ref 12–46)
Lymphs Abs: 2.2 10*3/uL (ref 0.7–4.0)
MCV: 87.2 fL (ref 78.0–100.0)
Monocytes Absolute: 0.5 10*3/uL (ref 0.1–1.0)
Neutro Abs: 3.8 10*3/uL (ref 1.7–7.7)
RBC: 3.74 MIL/uL — ABNORMAL LOW (ref 4.22–5.81)
RDW: 14.6 % (ref 11.5–15.5)
WBC: 6.6 10*3/uL (ref 4.0–10.5)

## 2012-05-14 LAB — RAPID URINE DRUG SCREEN, HOSP PERFORMED
Amphetamines: NOT DETECTED
Benzodiazepines: NOT DETECTED
Opiates: NOT DETECTED
Tetrahydrocannabinol: POSITIVE — AB

## 2012-05-14 LAB — ETHANOL: Alcohol, Ethyl (B): 56 mg/dL — ABNORMAL HIGH (ref 0–11)

## 2012-05-14 MED ORDER — NICOTINE 21 MG/24HR TD PT24
21.0000 mg | MEDICATED_PATCH | Freq: Once | TRANSDERMAL | Status: AC
  Administered 2012-05-14 – 2012-05-15 (×2): 21 mg via TRANSDERMAL
  Filled 2012-05-14 (×2): qty 1

## 2012-05-14 NOTE — ED Notes (Signed)
Pt wants detox from ETOH and cocaine, last used both today, denies SI or HI

## 2012-05-14 NOTE — ED Provider Notes (Signed)
History   This chart was scribed for Donnetta Hutching, MD by Sofie Rower. The patient was seen in room [facility]03/[facility]03 and the patient's care was started at 10:47 PM      CSN: [id number]  Arrival date & time 05/14/12  2114   First MD Initiated Contact with Patient 05/14/12 2200      Chief Complaint  Patient presents with  . V70.1    (Consider location/radiation/quality/duration/timing/severity/associated sxs/prior treatment) The history is provided by the patient. No language interpreter was used.    Devin Hodge is a 41 y.o. male who presents to the Emergency Department complaining of  gradual, progressively worsening, substance abuse onset today. The pt reports that he desires to go to detox for cocaine and alcohol. The pt informs that he usually does 3 Roxycodone and 3 Opanna on a regular basis, in addition to injecting cocaine. The pt has been drinking all day today, 05/14/12, consuming a 5th of alcohol. The pt has a hx of substance abuse and previously failed detox attempts.   The pt is employed as an Personnel officer.    Past Medical History  Diagnosis Date  . Kidney stone   . Status post alcohol detoxification     Past Surgical History  Procedure Date  . Pleural scarification     No family history on file.  History  Substance Use Topics  . Smoking status: Current Everyday Smoker -- 0.5 packs/day    Types: Cigarettes  . Smokeless tobacco: Not on file  . Alcohol Use: Yes      Review of Systems  All other systems reviewed and are negative.    Allergies  Sulfa antibiotics  Home Medications   Current Outpatient Rx  Name Route Sig Dispense Refill  . IBUPROFEN 200 MG PO TABS Oral Take 400 mg by mouth every 6 (six) hours as needed.      BP 138/82  Pulse 63  Temp 98.3 F (36.8 C) (Oral)  Resp 20  Ht 6' (1.829 m)  Wt 155 lb (70.308 kg)  BMI 21.02 kg/m2  SpO2 98%  Physical Exam  Nursing note and vitals reviewed. Constitutional: He is oriented to person,  place, and time. He appears well-developed and well-nourished.  HENT:  Head: Atraumatic.  Nose: Nose normal.  Eyes: Conjunctivae are normal. Pupils are equal, round, and reactive to light.  Neck: Normal range of motion.  Cardiovascular: Normal rate, regular rhythm and normal heart sounds.   Pulmonary/Chest: Effort normal and breath sounds normal. No respiratory distress.  Abdominal: Soft. Bowel sounds are normal.  Musculoskeletal: Normal range of motion.  Neurological: He is alert and oriented to person, place, and time.  Skin: Skin is warm and dry.  Psychiatric:       Flat affect.     ED Course  Procedures (including critical care time)  DIAGNOSTIC STUDIES: Oxygen Saturation is 98% on room air, normal by my interpretation.    COORDINATION OF CARE:    10:54PM- Treatment plan and behavioral health consult discussed with patient. Patient agrees to treatment plan.   Labs Reviewed  CBC WITH DIFFERENTIAL - Abnormal; Notable for the following:    RBC 3.74 (*)     Hemoglobin 11.3 (*)     HCT 32.6 (*)     All other components within normal limits  BASIC METABOLIC PANEL - Abnormal; Notable for the following:    Potassium 3.0 (*)     Glucose, Bld 110 (*)     GFR calc non Af Amer 85 (*)  All other components within normal limits  ETHANOL - Abnormal; Notable for the following:    Alcohol, Ethyl (B) 56 (*)     All other components within normal limits  URINE RAPID DRUG SCREEN (HOSP PERFORMED) - Abnormal; Notable for the following:    Cocaine POSITIVE (*)     Tetrahydrocannabinol POSITIVE (*)     All other components within normal limits   No results found.   No diagnosis found.    MDM   No suicidal or homicidal ideation. Patient has been using alcohol, cocaine, opiates. Wants detox     I personally performed the services described in this documentation, which was scribed in my presence. The recorded information has been reviewed and considered.    Donnetta Hutching,  MD 05/14/12 [phone]

## 2012-05-15 LAB — POTASSIUM: Potassium: 3.8 mEq/L (ref 3.5–5.1)

## 2012-05-15 MED ORDER — POTASSIUM CHLORIDE CRYS ER 20 MEQ PO TBCR
40.0000 meq | EXTENDED_RELEASE_TABLET | Freq: Once | ORAL | Status: AC
  Administered 2012-05-15: 40 meq via ORAL
  Filled 2012-05-15: qty 2

## 2012-05-15 MED ORDER — POTASSIUM CHLORIDE CRYS ER 20 MEQ PO TBCR
20.0000 meq | EXTENDED_RELEASE_TABLET | Freq: Once | ORAL | Status: AC
Start: 2012-05-15 — End: 2012-05-15
  Administered 2012-05-15: 20 meq via ORAL
  Filled 2012-05-15: qty 1

## 2012-05-15 NOTE — ED Notes (Signed)
Spoke with Dr. Juleen China who advised that pt did not need repeat potassium level drawn, called Thresa Ross at [facility] again and advised them of Dr. Marylen Ponto request. Devin Hodge states " You may just be saying that you gave him the potassium", offered to fax [facility] pt's record's, Devin Hodge refused stating that the er needed to wait until after shift change at [facility] at [facility] and call back to [facility] for pt acceptance. Dr. Juleen China notified, additional orders given

## 2012-05-15 NOTE — ED Notes (Signed)
Pt has been sleeping on and off throughout the day, asked pt if he would like to shower while he was here, pt requesting razor, advised pt that I would not be able to give him a razor, (pt does  Have full beard and mustache) pt becomes upset over not being given a razor, states " I want my clothes and I am ready to leave", Yolanda, RN ([facility]) notified, Dr. Juleen China notified, advised that pt is able to leave, pt taken his belongings, when I went back into room to discharge pt he had already left.

## 2012-05-15 NOTE — ED Notes (Signed)
Pt given breakfast tray, updated on plan of care,

## 2012-05-15 NOTE — ED Notes (Signed)
Repeat Potassium faxed to [facility] as requested by Hattie Perch.

## 2012-05-15 NOTE — BH Assessment (Signed)
Assessment Note   Devin Hodge is an 41 y.o. male. Patient requesting detox. Spoke with [name] at  [facility] who has concerns with patient's CBC results, faxed over past results indicating patient's baseline is below average awaiting a call back. Spoke with patient concerning possible other placements and transportation concerns. Patient stated that he will talk with his mother but he feels that he definitely needs to go somewhere.  Axis I: Polysubstance Dependence Axis II: Deferred Axis III:  Past Medical History  Diagnosis Date  . Kidney stone   . Status post alcohol detoxification    Axis IV: other psychosocial or environmental problems and problems related to social environment Axis V: 40  Past Medical History:  Past Medical History  Diagnosis Date  . Kidney stone   . Status post alcohol detoxification     Past Surgical History  Procedure Date  . Pleural scarification     Family History: No family history on file.  Social History:  reports that he has been smoking Cigarettes.  He has been smoking about .5 packs per day. He does not have any smokeless tobacco history on file. He reports that he drinks alcohol. He reports that he uses illicit drugs (IV and Cocaine).  Additional Social History:  Alcohol / Drug Use Pain Medications: YES Prescriptions: NA Over the Counter: NA History of alcohol / drug use?: Yes Substance #1 Name of Substance 1: ALCOHOL 1 - Age of First Use: 16 1 - Amount (size/oz): 1/5  LIQUOR 1 - Frequency: DAILY 1 - Duration: 1 YR 1 - Last Use / Amount: 05/14/12  1/5 LIQUOR Substance #2 Name of Substance 2: COCAINE 2 - Age of First Use: 18 2 - Amount (size/oz): $20-200 2 - Frequency: DAILY 2 - Duration: 5 YR 2 - Last Use / Amount: 05/14/12  $20 Substance #3 Name of Substance 3: OPIATES-ROXYCODONE, OCCASSIONAL OPANA 3 - Age of First Use: 18 3 - Amount (size/oz): 3 3 - Frequency: 1-2 X WEEK 3 - Duration: 5 YEAR 3 - Last Use / Amount: 4 DAYS  AGO Substance #4 Name of Substance 4: MARIJUANA 4 - Age of First Use: 13 4 - Amount (size/oz): 1 JOINT 4 - Frequency: 3-4 X WEEK 4 - Duration: 20 YRS 4 - Last Use / Amount: 05/14/12  CIWA: CIWA-Ar BP: 113/68 mmHg Pulse Rate: 70  Nausea and Vomiting: mild nausea with no vomiting Tactile Disturbances: mild itching, pins and needles, burning or numbness Tremor: two Auditory Disturbances: not present Paroxysmal Sweats: barely perceptible sweating, palms moist Visual Disturbances: very mild sensitivity Anxiety: two Headache, Fullness in Head: very mild Agitation: somewhat more than normal activity Orientation and Clouding of Sensorium: oriented and can do serial additions CIWA-Ar Total: 11  COWS:    Allergies:  Allergies  Allergen Reactions  . Sulfa Antibiotics Rash    Home Medications:  (Not in a hospital admission)  OB/GYN Status:  No LMP for male patient.  General Assessment Data Location of Assessment: [facility] ACT Assessment: Yes Living Arrangements: Alone Can pt return to current living arrangement?: Yes Admission Status: Voluntary Is patient capable of signing voluntary admission?: Yes Transfer from: Acute Hospital Referral Source: MD  Education Status Contact person: [name]-MOTHER-[phone]  Risk to self Suicidal Ideation: No Suicidal Intent: No Is patient at risk for suicide?: No Suicidal Plan?: No Access to Means: No What has been your use of drugs/alcohol within the last 12 months?:  (Daily) Previous Attempts/Gestures: No How many times?: 0  Other Self  Harm Risks: NA Triggers for Past Attempts: None known Intentional Self Injurious Behavior: None Family Suicide History: Unknown Persecutory voices/beliefs?: No Depression: No Substance abuse history and/or treatment for substance abuse?: Yes Suicide prevention information given to non-admitted patients: Not applicable  Risk to Others Homicidal Ideation: No Thoughts of Harm to Others:  No Current Homicidal Intent: No Current Homicidal Plan: No Access to Homicidal Means: No History of harm to others?: No Assessment of Violence: None Noted Does patient have access to weapons?: No Criminal Charges Pending?: No Does patient have a court date: No  Psychosis Hallucinations: None noted Delusions: None noted  Mental Status Report Appear/Hygiene: Improved Eye Contact: Good Motor Activity:  (WNL) Speech: Logical/coherent Level of Consciousness: Alert Mood: Anxious;Guilty Affect: Appropriate to circumstance Anxiety Level: Minimal Thought Processes: Coherent;Relevant Judgement: Unimpaired Orientation: Person;Place;Time;Situation Obsessive Compulsive Thoughts/Behaviors: None  Cognitive Functioning Concentration: Normal Memory: Recent Intact;Remote Intact IQ: Average Insight: Fair Impulse Control: Fair Appetite: Fair Sleep: No Change Total Hours of Sleep:  (6) Vegetative Symptoms: None  ADLScreening [facility] Assessment Services) Patient's cognitive ability adequate to safely complete daily activities?: Yes Patient able to express need for assistance with ADLs?: Yes Independently performs ADLs?: Yes (appropriate for developmental age)  Abuse/Neglect [facility]) Physical Abuse: Denies Verbal Abuse: Denies Sexual Abuse: Denies  Prior Inpatient Therapy Prior Inpatient Therapy: Yes Prior Therapy Dates: UNK Prior Therapy Facilty/Provider(s): [facility] Reason for Treatment: DETOX-REHAB  Prior Outpatient Therapy Prior Outpatient Therapy: No  ADL Screening (condition at time of admission) Patient's cognitive ability adequate to safely complete daily activities?: Yes Patient able to express need for assistance with ADLs?: Yes Independently performs ADLs?: Yes (appropriate for developmental age) Weakness of Legs: None Weakness of Arms/Hands: None  Home Assistive Devices/Equipment Home Assistive Devices/Equipment: None  Therapy Consults (therapy consults require a  physician order) PT Evaluation Needed: No OT Evalulation Needed: No SLP Evaluation Needed: No Abuse/Neglect Assessment (Assessment to be complete while patient is alone) Physical Abuse: Denies Verbal Abuse: Denies Sexual Abuse: Denies Exploitation of patient/patient's resources: Denies Self-Neglect: Denies Values / Beliefs Cultural Requests During Hospitalization: None Spiritual Requests During Hospitalization: None Consults Spiritual Care Consult Needed: No Social Work Consult Needed: No Merchant navy officer (For Healthcare) Advance Directive: Patient does not have advance directive;Patient has advance directive, copy in chart Pre-existing out of facility DNR order (yellow form or pink MOST form): No Nutrition Screen- [facility] Patient's home diet: Regular  Additional Information 1:1 In Past 12 Months?: No CIRT Risk: No Elopement Risk: No Does patient have medical clearance?: Yes     Disposition:  Disposition Disposition of Patient: Referred to Type of inpatient treatment program: Adult Patient referred to: [facility])  On Site Evaluation by:   Reviewed with Physician:     Rudi Coco 05/15/2012 12:06 PM

## 2012-05-15 NOTE — BHH Counselor (Signed)
Patient declined at [facility] by [employer] due to Low HGB/HCT, feels that patient has something going on medically.

## 2012-05-15 NOTE — ED Notes (Signed)
Given diet coke and crackers at patient request for something to eat.

## 2012-05-15 NOTE — ED Notes (Signed)
Pt resting with eyes closed, even rise and fall of chest area.
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
## 2012-05-15 NOTE — BH Assessment (Addendum)
Assessment Note   Devin Hodge is an 41 y.o. male. PT IS SEEKING DETOX. HE REPORTS HAVING A FIVE YEAR SOBRIETY STATUS BUT DOES NOT SEEM TO BE ABLE TO STOP USING ON HIS OWN THIS TIME.   HE REPORTS DAILY DRINKING AND COCAINE USE BY SHOOTING UP. HE SMOKES MARIJUANA ON A REGULAR BASIS. HE ALSO TAKES OPIATES OCCASIONALLY.  PT DENIES S/I, H/I, AND IS NOT PSYCHOTIC.  SPOKE TO ADOLPH AT [facility] REGARDING ACCEPTANCE FOR 05/15/12, [facility] WILL BE OBTAINED . DR Adriana Simas AGREES WITH RECOMMENDATION.      Axis I: POLYSUBSTANCE DEPENDENCY Axis II: Deferred Axis III:  Past Medical History  Diagnosis Date  . Kidney stone   . Status post alcohol detoxification    Axis IV: economic problems, occupational problems, other psychosocial or environmental problems and problems with primary support group Axis V: 21-30 behavior considerably influenced by delusions or hallucinations OR serious impairment in judgment, communication OR inability to function in almost all areas       Past Medical History:  Past Medical History  Diagnosis Date  . Kidney stone   . Status post alcohol detoxification     Past Surgical History  Procedure Date  . Pleural scarification     Family History: No family history on file.  Social History:  reports that he has been smoking Cigarettes.  He has been smoking about .5 packs per day. He does not have any smokeless tobacco history on file. He reports that he drinks alcohol. He reports that he uses illicit drugs (IV and Cocaine).  Additional Social History:  Alcohol / Drug Use Pain Medications: YES Prescriptions: NA Over the Counter: NA History of alcohol / drug use?: Yes Substance #1 Name of Substance 1: ALCOHOL 1 - Age of First Use: 16 1 - Amount (size/oz): 1/5  LIQUOR 1 - Frequency: DAILY 1 - Duration: 1 YR 1 - Last Use / Amount: 05/14/12  1/5 LIQUOR Substance #2 Name of Substance 2: COCAINE 2 - Age of First Use: 18 2 - Amount (size/oz): $20-200 2 -  Frequency: DAILY 2 - Duration: 5 YR 2 - Last Use / Amount: 05/14/12  $20 Substance #3 Name of Substance 3: OPIATES-ROXYCODONE, OCCASSIONAL OPANA 3 - Age of First Use: 18 3 - Amount (size/oz): 3 3 - Frequency: 1-2 X WEEK 3 - Duration: 5 YEAR 3 - Last Use / Amount: 4 DAYS AGO Substance #4 Name of Substance 4: MARIJUANA 4 - Age of First Use: 13 4 - Amount (size/oz): 1 JOINT 4 - Frequency: 3-4 X WEEK 4 - Duration: 20 YRS 4 - Last Use / Amount: 05/14/12  CIWA: CIWA-Ar BP: 138/82 mmHg Pulse Rate: 63  Nausea and Vomiting: mild nausea with no vomiting Tactile Disturbances: mild itching, pins and needles, burning or numbness Tremor: two Auditory Disturbances: not present Paroxysmal Sweats: barely perceptible sweating, palms moist Visual Disturbances: very mild sensitivity Anxiety: two Headache, Fullness in Head: very mild Agitation: somewhat more than normal activity Orientation and Clouding of Sensorium: oriented and can do serial additions CIWA-Ar Total: 11  COWS:    Allergies:  Allergies  Allergen Reactions  . Sulfa Antibiotics Rash    Home Medications:  (Not in a hospital admission)  OB/GYN Status:  No LMP for male patient.  General Assessment Data Location of Assessment: [facility] ACT Assessment: Yes Living Arrangements: Alone Can pt return to current living arrangement?: Yes Admission Status: Voluntary Is patient capable of signing voluntary admission?: Yes Transfer from: Acute Hospital Referral Source:  MD (DR ZAMMIT)  Education Status Contact person: [name]-MOTHER-[phone]  Risk to self Suicidal Ideation: No Suicidal Intent: No Is patient at risk for suicide?: No Suicidal Plan?: No Access to Means: No What has been your use of drugs/alcohol within the last 12 months?: LIQUOR, COCAINE, MARIJUANA, OPIATES Previous Attempts/Gestures: No How many times?: 0  Other Self Harm Risks: NA Triggers for Past Attempts: None known Intentional Self Injurious  Behavior: None Family Suicide History: Unknown Persecutory voices/beliefs?: No Depression: No Substance abuse history and/or treatment for substance abuse?: Yes Suicide prevention information given to non-admitted patients: Not applicable  Risk to Others Homicidal Ideation: No Thoughts of Harm to Others: No Current Homicidal Intent: No Current Homicidal Plan: No Access to Homicidal Means: No History of harm to others?: No Assessment of Violence: None Noted Does patient have access to weapons?: No Criminal Charges Pending?: No Does patient have a court date: No  Psychosis Hallucinations: None noted Delusions: None noted  Mental Status Report Appear/Hygiene: Improved Eye Contact: Good Motor Activity: Freedom of movement Speech: Logical/coherent Level of Consciousness: Alert Mood: Guilty;Helpless Affect: Appropriate to circumstance Anxiety Level: Minimal Thought Processes: Coherent;Relevant Judgement: Unimpaired Orientation: Person;Place;Time;Situation Obsessive Compulsive Thoughts/Behaviors: None  Cognitive Functioning Concentration: Normal Memory: Recent Intact;Remote Intact IQ: Average Insight: Fair Impulse Control: Fair Appetite: Fair Sleep: No Change Total Hours of Sleep: 6  Vegetative Symptoms: None  ADLScreening [facility] Assessment Services) Patient's cognitive ability adequate to safely complete daily activities?: Yes Patient able to express need for assistance with ADLs?: Yes Independently performs ADLs?: Yes (appropriate for developmental age)  Abuse/Neglect [facility]) Physical Abuse: Denies Verbal Abuse: Denies Sexual Abuse: Denies  Prior Inpatient Therapy Prior Inpatient Therapy: Yes Prior Therapy Dates: UNK Prior Therapy Facilty/Provider(s): [facility] Reason for Treatment: DETOX-REHAB  Prior Outpatient Therapy Prior Outpatient Therapy: No  ADL Screening (condition at time of admission) Patient's cognitive ability adequate to safely complete daily  activities?: Yes Patient able to express need for assistance with ADLs?: Yes Independently performs ADLs?: Yes (appropriate for developmental age) Weakness of Legs: None Weakness of Arms/Hands: None  Home Assistive Devices/Equipment Home Assistive Devices/Equipment: None  Therapy Consults (therapy consults require a physician order) PT Evaluation Needed: No OT Evalulation Needed: No SLP Evaluation Needed: No Abuse/Neglect Assessment (Assessment to be complete while patient is alone) Physical Abuse: Denies Verbal Abuse: Denies Sexual Abuse: Denies Exploitation of patient/patient's resources: Denies Self-Neglect: Denies Values / Beliefs Cultural Requests During Hospitalization: None Spiritual Requests During Hospitalization: None Consults Spiritual Care Consult Needed: No Social Work Consult Needed: No Merchant navy officer (For Healthcare) Advance Directive: Patient does not have advance directive;Patient has advance directive, copy in chart Pre-existing out of facility DNR order (yellow form or pink MOST form): No    Additional Information 1:1 In Past 12 Months?: Yes CIRT Risk: No Elopement Risk: No Does patient have medical clearance?: Yes     Disposition: PENDING [facility] Disposition Disposition of Patient: Referred to Type of inpatient treatment program: Adult Patient referred to: [facility] (SPOKE WITH ADOLPH)  On Site Evaluation by:   Reviewed with Physician:  DR Raoul Pitch Winford 05/15/2012 12:45 AM

## 2012-05-20 ENCOUNTER — Emergency Department (HOSPITAL_COMMUNITY): Payer: Self-pay

## 2012-05-20 ENCOUNTER — Emergency Department (HOSPITAL_COMMUNITY)
Admission: EM | Admit: 2012-05-20 | Discharge: 2012-05-20 | Disposition: A | Discharge status: Home / Self Care | Payer: Self-pay | Attending: Emergency Medicine | Admitting: Emergency Medicine

## 2012-05-20 ENCOUNTER — Encounter (HOSPITAL_COMMUNITY): Payer: Self-pay | Admitting: Emergency Medicine

## 2012-05-20 DIAGNOSIS — Y9389 Activity, other specified: Secondary | ICD-10-CM | POA: Insufficient documentation

## 2012-05-20 DIAGNOSIS — S82839A Other fracture of upper and lower end of unspecified fibula, initial encounter for closed fracture: Secondary | ICD-10-CM | POA: Insufficient documentation

## 2012-05-20 DIAGNOSIS — S82832A Other fracture of upper and lower end of left fibula, initial encounter for closed fracture: Secondary | ICD-10-CM

## 2012-05-20 DIAGNOSIS — M25569 Pain in unspecified knee: Secondary | ICD-10-CM

## 2012-05-20 DIAGNOSIS — F17200 Nicotine dependence, unspecified, uncomplicated: Secondary | ICD-10-CM | POA: Insufficient documentation

## 2012-05-20 DIAGNOSIS — Y998 Other external cause status: Secondary | ICD-10-CM | POA: Insufficient documentation

## 2012-05-20 DIAGNOSIS — W172XXA Fall into hole, initial encounter: Secondary | ICD-10-CM | POA: Insufficient documentation

## 2012-05-20 MED ORDER — NAPROXEN 500 MG PO TABS: 500.0000 mg | ORAL_TABLET | Freq: Two times a day (BID) | ORAL | Status: DC

## 2012-05-20 MED ORDER — OXYCODONE-ACETAMINOPHEN 5-325 MG PO TABS
2.0000 | ORAL_TABLET | Freq: Once | ORAL | Status: AC
  Administered 2012-05-20: 2 via ORAL
  Filled 2012-05-20: qty 2

## 2012-05-20 MED ORDER — TRAMADOL HCL 50 MG PO TABS: 50.0000 mg | ORAL_TABLET | Freq: Four times a day (QID) | ORAL | Status: DC | PRN

## 2012-05-20 NOTE — ED Notes (Signed)
States he has a fracture in his left knee that occurred about 30 days ago.  States it is causing him problems tonight.
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
## 2012-05-20 NOTE — ED Notes (Signed)
Patient did not have knee immobilizer with him.  States he cannot wear it and walk on unlevel ground and cannot wear when he works.  Has no one to call to transport him home.  States he will walk home - approximately 4 blocks

## 2012-05-23 ENCOUNTER — Encounter (HOSPITAL_COMMUNITY): Payer: Self-pay | Admitting: *Deleted

## 2012-05-23 ENCOUNTER — Emergency Department (HOSPITAL_COMMUNITY)
Admission: EM | Admit: 2012-05-23 | Discharge: 2012-05-24 | Discharge status: Against Medical Advice | Payer: Self-pay | Attending: Emergency Medicine | Admitting: Emergency Medicine

## 2012-05-23 DIAGNOSIS — R6883 Chills (without fever): Secondary | ICD-10-CM | POA: Insufficient documentation

## 2012-05-23 DIAGNOSIS — F1010 Alcohol abuse, uncomplicated: Secondary | ICD-10-CM | POA: Insufficient documentation

## 2012-05-23 DIAGNOSIS — F17200 Nicotine dependence, unspecified, uncomplicated: Secondary | ICD-10-CM | POA: Insufficient documentation

## 2012-05-23 DIAGNOSIS — H538 Other visual disturbances: Secondary | ICD-10-CM | POA: Insufficient documentation

## 2012-05-23 DIAGNOSIS — F1910 Other psychoactive substance abuse, uncomplicated: Secondary | ICD-10-CM | POA: Insufficient documentation

## 2012-05-23 DIAGNOSIS — R197 Diarrhea, unspecified: Secondary | ICD-10-CM | POA: Insufficient documentation

## 2012-05-23 LAB — COMPREHENSIVE METABOLIC PANEL
ALT: 54 U/L — ABNORMAL HIGH (ref 0–53)
AST: 35 U/L (ref 0–37)
Alkaline Phosphatase: 78 U/L (ref 39–117)
CO2: 26 mEq/L (ref 19–32)
Calcium: 9.4 mg/dL (ref 8.4–10.5)
Chloride: 104 mEq/L (ref 96–112)
GFR calc Af Amer: >90 mL/min (ref >90)
GFR calc non Af Amer: >90 mL/min (ref >90)
Glucose, Bld: 111 mg/dL — ABNORMAL HIGH (ref 70–99)
Sodium: 138 mEq/L (ref 135–145)
Total Bilirubin: 0.3 mg/dL (ref 0.3–1.2)

## 2012-05-23 LAB — CBC WITH DIFFERENTIAL/PLATELET
Basophils Absolute: 0 10*3/uL (ref 0.0–0.1)
Eosinophils Relative: 1 % (ref 0–5)
HCT: 34.8 % — ABNORMAL LOW (ref 39.0–52.0)
Lymphocytes Relative: 29 % (ref 12–46)
Lymphs Abs: 2 10*3/uL (ref 0.7–4.0)
MCV: 87.7 fL (ref 78.0–100.0)
Neutro Abs: 4.2 10*3/uL (ref 1.7–7.7)
Platelets: 177 10*3/uL (ref 150–400)
RBC: 3.97 MIL/uL — ABNORMAL LOW (ref 4.22–5.81)
RDW: 14.8 % (ref 11.5–15.5)
WBC: 6.7 10*3/uL (ref 4.0–10.5)

## 2012-05-23 LAB — ETHANOL: Alcohol, Ethyl (B): <11 mg/dL (ref 0–11)

## 2012-05-23 LAB — RAPID URINE DRUG SCREEN, HOSP PERFORMED
Barbiturates: NOT DETECTED
Benzodiazepines: NOT DETECTED

## 2012-05-23 MED ORDER — NICOTINE 14 MG/24HR TD PT24
14.0000 mg | MEDICATED_PATCH | Freq: Every day | TRANSDERMAL | Status: DC
  Administered 2012-05-24 (×2): 14 mg via TRANSDERMAL
  Filled 2012-05-23 (×2): qty 1

## 2012-05-23 NOTE — ED Provider Notes (Signed)
History   This chart was scribed for Vida Roller, MD scribed by Magnus Sinning. The patient was seen in room APA15/APA15 at 23:27   CSN: [id number]  Arrival date & time 05/23/12  2220      Chief Complaint  Patient presents with  . V70.1    (Consider location/radiation/quality/duration/timing/severity/associated sxs/prior treatment) HPI Devin Hodge is a 41 y.o. male who presents to the Emergency Department for detox from alcohol, prescription drug use, and cocaine, which he injects into his arm. Patient states that he drinks liquor daily, noting that today he has drank an 18 pack of beer approximately 6 hours ago.  Patient also admits to using cocaine earlier today. Patient states he is tired of abusing and says he has not attempted detox since 20 years ago.Reports chills, and  blurry vision. Also states he is a current smoker, but does not report any other medical complaints or conditions at this time. Patient was seen approximately one week ago here in the ED for treatment of a fractured fistula.   His alcohol use has been daily, persistent, severe, not associated with depression or suicidality  Past Medical History  Diagnosis Date  . Kidney stone   . Status post alcohol detoxification     Past Surgical History  Procedure Date  . Pleural scarification     No family history on file.  History  Substance Use Topics  . Smoking status: Current Everyday Smoker -- 0.5 packs/day    Types: Cigarettes  . Smokeless tobacco: Not on file  . Alcohol Use: Yes      Review of Systems  Constitutional: Positive for chills. Negative for fever.  Eyes: Negative for visual disturbance.  Gastrointestinal: Positive for diarrhea (watery). Negative for nausea and vomiting.  Skin: Negative for rash.  Neurological: Negative for weakness and numbness.  All other systems reviewed and are negative.    Allergies  Sulfa antibiotics  Home Medications   Current Outpatient Rx  Name  Route Sig Dispense Refill  . IBUPROFEN 200 MG PO TABS Oral Take 800 mg by mouth as needed.     Marland Kitchen NAPROXEN 500 MG PO TABS Oral Take 1 tablet (500 mg total) by mouth 2 (two) times daily with a meal. 30 tablet 0  . TRAMADOL HCL 50 MG PO TABS Oral Take 1 tablet (50 mg total) by mouth every 6 (six) hours as needed for pain. 15 tablet 0    BP 132/83  Pulse 78  Temp 98.3 F (36.8 C)  Resp 20  Ht 6' (1.829 m)  Wt 155 lb (70.308 kg)  BMI 21.02 kg/m2  SpO2 96%  Physical Exam  Nursing note and vitals reviewed. Constitutional: He is oriented to person, place, and time. He appears well-developed and well-nourished. No distress.  HENT:  Head: Normocephalic and atraumatic.       Mucous membranes are moist  Eyes: Conjunctivae and EOM are normal. Pupils are equal, round, and reactive to light. Right eye exhibits no discharge. Left eye exhibits no discharge. No scleral icterus.  Neck: Neck supple. No tracheal deviation present.  Cardiovascular: Normal rate and regular rhythm.        Strong pulses at the radial arteries  Pulmonary/Chest: Effort normal. No respiratory distress.  Abdominal: Soft. He exhibits no distension. There is no tenderness.  Musculoskeletal: Normal range of motion. He exhibits tenderness ( Tenderness over the proximal fibula on the left lower extremity). He exhibits no edema.       Track marks in  the left antecubital fossa, no signs of infection  Lymphadenopathy:    He has no cervical adenopathy.  Neurological: He is alert and oriented to person, place, and time. No sensory deficit.  Skin: Skin is warm and dry.       Track marks as described, no induration, pustules or tenderness to the skin  Psychiatric: He has a normal mood and affect. His behavior is normal.       No depression or suicidality    ED Course  Procedures (including critical care time) DIAGNOSTIC STUDIES: Oxygen Saturation is 96% on room air, normal by my interpretation.    COORDINATION OF  CARE:    Labs Reviewed  CBC WITH DIFFERENTIAL - Abnormal; Notable for the following:    RBC 3.97 (*)     Hemoglobin 11.9 (*)     HCT 34.8 (*)     All other components within normal limits  COMPREHENSIVE METABOLIC PANEL - Abnormal; Notable for the following:    Glucose, Bld 111 (*)     ALT 54 (*)     All other components within normal limits  URINE RAPID DRUG SCREEN (HOSP PERFORMED) - Abnormal; Notable for the following:    Opiates POSITIVE (*)     Cocaine POSITIVE (*)     Tetrahydrocannabinol POSITIVE (*)     All other components within normal limits  ETHANOL   No results found.   1. Substance abuse       MDM  The patient does not have any significant alcohol withdrawal signs at this time, he has normal vital signs and no tremor. His history suggests that he would benefit from alcohol detox and cocaine detox. I have discussed his care with the evaluation team who states that there are no beds this evening, he will be evaluated again in the morning for placement at behavioral health in [place]. Until that time, we'll keep the patient comfortable with Ativan when necessary for withdrawal symptoms, nicotine patch.  The patient has chronic pain secondary to fracture of his proximal fibula on the left lower extremity, this is not changed. There is no deformity or signs of infection or swelling of the left leg.  I personally performed the services described in this documentation, which was scribed in my presence. The recorded information has been reviewed and considered.     Change of shift - care signed out to Dr. Candie Chroman, MD 05/24/12 0700

## 2012-05-23 NOTE — ED Notes (Signed)
Pt states he has been abusing alcohol and pills for 15 yrs and is requesting detox. Pt has been using alcohol, pills, and cocaine today. Pt denies SI/HI but states if he doesn't get help, the drug and alcohol abuse is going to kill him.

## 2012-05-24 ENCOUNTER — Emergency Department (HOSPITAL_COMMUNITY)
Admission: EM | Admit: 2012-05-24 | Discharge: 2012-05-25 | Disposition: A | Discharge status: Other Acute Inpatient Hospital | Payer: Self-pay | Attending: Emergency Medicine | Admitting: Emergency Medicine

## 2012-05-24 ENCOUNTER — Encounter (HOSPITAL_COMMUNITY): Payer: Self-pay

## 2012-05-24 ENCOUNTER — Inpatient Hospital Stay (HOSPITAL_COMMUNITY): Admission: AD | Admit: 2012-05-24 | Payer: 59 | Source: Other Acute Inpatient Hospital | Admitting: Psychiatry

## 2012-05-24 DIAGNOSIS — R45851 Suicidal ideations: Secondary | ICD-10-CM | POA: Insufficient documentation

## 2012-05-24 MED ORDER — LORAZEPAM 1 MG PO TABS
2.0000 mg | ORAL_TABLET | Freq: Once | ORAL | Status: AC
  Administered 2012-05-24: 2 mg via ORAL

## 2012-05-24 MED ORDER — LORAZEPAM 1 MG PO TABS
ORAL_TABLET | ORAL | Status: AC
  Filled 2012-05-24: qty 2

## 2012-05-24 NOTE — ED Notes (Signed)
Patient was here yesterday for help getting detox.  Patient had bed at [facility] and was waiting for [facility] for voluntary admission.  Patient decided to leave.  Patient left and has taken 4 opiates today.  Patient states he said he wanted to hurt himself for [place] to make him stay for treatment.  Patient states he is sorry for wasting our time today.  Patient has no plan to harm himself.

## 2012-05-24 NOTE — ED Notes (Signed)
Pt states he feels like he is getting hot and cold and his body is aching, will notify edp

## 2012-05-24 NOTE — ED Notes (Signed)
Pt questioning if family could send him there, explained to pt that we could not do that and its against [facility] policy, pt wanting to sign out AMA, EDP notified and stated to get him to sign out AMA, pt without SI or HI, pt belongings returned to pt, pt signed the [facility] form

## 2012-05-24 NOTE — BH Assessment (Signed)
[facility] Assessment Progress Note      05/24/2012 12:39 AM  Patient has been referred to [facility], [facility], and [facility]. Spoke with [facility] and sent referral for consideration. [facility] stated they did not have any beds.  Shon Baton, MSW, LCSW, LCASA, CSW-G

## 2012-05-24 NOTE — ED Notes (Signed)
Pt sleeping nad

## 2012-05-24 NOTE — BH Assessment (Signed)
Assessment Note   Devin Hodge is an 41 y.o. male. Patient returns to the emergency room on this date requesting detox. He was seen 1 week earlier, for same issue. He reports that when [facility] turned him down, he just left, frustrated and anxious. He returns tonight hoping that he will be able to get a detox bed somewhere at a facility. He denies suicidal or homicidal idealtion, no delusions or hallucinations noted. He denies and pending court dates or being on probation.  He has had a previous inpatient history for substance abuse at [facility] in 1991. He stated he completed a detox program, then did 28 days of rehab. He then went to [facility], which is a halfway house program in [place], [place]. He states that he is interested in completing a detox, then being referred to a rehab program and then try to go to a halfway house program again, this time outside of [facility]. Patient is calm, cooperative. He has had no history of DTs or withdrawal seizures. He states that his stomach is somewhat upset, achy, hot then cold. He is quiet, cooperative and says he wants to get treatment this time. He states he has used alcohol today, about 12-14 beers. He started using alcohol at at 16 and has been using at this rate for one year, daily. He has been using opiates daily, about 3-4 roxycodones, and started using opiates at the age of 5, with his last use this afternoon. He admits to using cocaine and he is shooting it; saying he used about a gram in the last 24 hours. He reports he does not use this daily, but about 3-4 times per week, and started using cocaine when he was 41 years old. He also using cannabis daily, which he started when he was 41 years old, using one joint per day on average. He says he is sick and tired of living like this and wants to get clean again. He reports he stayed clean for a number of years after completing [facility] treatment, then started to use narcotics and drink on and off after that, leading  up to this point now.   Axis I: Substance Abuse; Polysubstance Dependence Axis II: Deferred Axis III: Hx of Kidney stone Axis IV: no significant stressors noted by patient.  Axis V: GAF 60  Past Medical History:  Past Medical History  Diagnosis Date  . Kidney stone   . Status post alcohol detoxification     Past Surgical History  Procedure Date  . Pleural scarification     Family History: No family history on file.  Social History:  reports that he has been smoking Cigarettes.  He has been smoking about .5 packs per day. He does not have any smokeless tobacco history on file. He reports that he drinks alcohol. He reports that he uses illicit drugs (Cocaine).  Additional Social History:     CIWA: CIWA-Ar BP: 132/83 mmHg Pulse Rate: 78  COWS:    Allergies:  Allergies  Allergen Reactions  . Sulfa Antibiotics Rash    Home Medications:  (Not in a hospital admission)  OB/GYN Status:  No LMP for male patient.  General Assessment Data Location of Assessment: [facility] ED ACT Assessment: Yes Living Arrangements: Alone Can pt return to current living arrangement?: Yes Admission Status: Voluntary Is patient capable of signing voluntary admission?: Yes Transfer from: Acute Hospital Referral Source: MD  Education Status Is patient currently in school?: No  Risk to self Suicidal Ideation: No Suicidal  Intent: No Is patient at risk for suicide?: No Suicidal Plan?: No Access to Means: No What has been your use of drugs/alcohol within the last 12 months?:  (ETOH, Cocaine, THC, Opiates) Previous Attempts/Gestures: No Other Self Harm Risks:  (none) Triggers for Past Attempts: None known Intentional Self Injurious Behavior: None Family Suicide History: No;Unknown Persecutory voices/beliefs?: No Depression: No Substance abuse history and/or treatment for substance abuse?: Yes Suicide prevention information given to non-admitted patients: Not applicable  Risk to  Others Homicidal Ideation: No Thoughts of Harm to Others: No Current Homicidal Intent: No Current Homicidal Plan: No Access to Homicidal Means: No History of harm to others?: No Assessment of Violence: None Noted Does patient have access to weapons?: No Criminal Charges Pending?: No Does patient have a court date: No  Psychosis Hallucinations: None noted Delusions: None noted  Mental Status Report Appear/Hygiene: Improved Eye Contact: Good Motor Activity: Freedom of movement Speech: Logical/coherent;Soft Level of Consciousness: Alert;Quiet/awake Mood: Anxious Affect: Anxious;Appropriate to circumstance Anxiety Level: Minimal Thought Processes: Coherent Judgement: Unimpaired Orientation: Person;Place;Time;Situation;Appropriate for developmental age Obsessive Compulsive Thoughts/Behaviors: None  Cognitive Functioning Concentration: Normal Memory: Recent Intact;Remote Intact IQ: Average Insight: Fair Impulse Control: Fair Appetite: Fair Sleep: No Change Total Hours of Sleep: 6  Vegetative Symptoms: None  ADLScreening [facility]) Patient's cognitive ability adequate to safely complete daily activities?: Yes Patient able to express need for assistance with ADLs?: Yes Independently performs ADLs?: Yes (appropriate for developmental age)  Abuse/Neglect [facility]) Physical Abuse: Denies Verbal Abuse: Denies Sexual Abuse: Denies  Prior Inpatient Therapy Prior Inpatient Therapy: Yes Prior Therapy Dates:  (1991) Prior Therapy Facilty/Provider(s): [facility] ([facility]) Reason for Treatment:  (detox, rehab and halfway house placement)  Prior Outpatient Therapy Prior Outpatient Therapy: No  ADL Screening (condition at time of admission) Patient's cognitive ability adequate to safely complete daily activities?: Yes Patient able to express need for assistance with ADLs?: Yes Independently performs ADLs?: Yes (appropriate for developmental age)        Abuse/Neglect Assessment (Assessment to be complete while patient is alone) Physical Abuse: Denies Verbal Abuse: Denies Sexual Abuse: Denies Values / Beliefs Cultural Requests During Hospitalization: None Spiritual Requests During Hospitalization: None        Additional Information 1:1 In Past 12 Months?: No CIRT Risk: No Elopement Risk: No Does patient have medical clearance?: Yes     Disposition:  Disposition Disposition of Patient: Inpatient treatment program Type of inpatient treatment program: Adult Patient referred to: [facility]  On Site Evaluation by:  Dr. Hyacinth Meeker Reviewed with Physician:  Dr. Hyacinth Meeker  Patient will be referred to [facility], [facility] and [facility] for treatment.    Shon Baton H 05/24/2012 12:07 AM

## 2012-05-24 NOTE — ED Notes (Signed)
Pt accepted at [facility] today per ACT Team member.

## 2012-05-24 NOTE — BH Assessment (Signed)
[facility] Assessment Progress Note    05/24/2012  9:30 AM Spoke with patient, did quick assessment to ensure he was not SI or HI; no delusions or hallucinations noted. Completed a COWS as patient appears to be experiencing more of a narcotic withdrawal symptom versus anything else at this time. Patient has been accepted to [facility]. He has signed his release of information and his volunatary admission form. He agrees to go by [facility] for transfer. Patient was accepted by Jennette Kettle, to [facility], to room 301-bed 1. Contacted [facility] and spoke with Marylene Land; she authorized him for detox from 05/24/2012 to 05/26/2012, authorization [place] [id number]. Patient continues to cooperative, quiet.   Shon Baton, MSW, LCSW, LCASA, CSW-G

## 2012-05-24 NOTE — ED Notes (Signed)
Pt had stated that he would get his family to take him to [facility], all started when his daughter came to visit him here at [facility], pt states that he has not seen his daughter in 3 years and was wanting to spend time with her, [facility] has been called to cancel truck and [place] with ACT is also aware

## 2012-05-24 NOTE — ED Notes (Signed)
Pt up to restroom.

## 2012-05-24 NOTE — ED Notes (Signed)
Pt sts "I'm having thoughts of hurting himself/suicide."  No plan. Pt sts he wants help with substance abuse, pain meds.

## 2012-05-24 NOTE — BH Assessment (Signed)
[facility] Assessment Progress Note      05/24/2012 1:05 AM Patient was declined at [facility]; due to the amount of walking that is required there; Christen's concern mainly was due to the stairs and the campus being spread out; his ability to be able to do all of that walking. [facility] does not have any beds.  Health will run him in the morning and will contact.  Shon Baton, MSW, LCSW, LCASA, CSW-G

## 2012-05-24 NOTE — ED Notes (Signed)
Pt was denied at [facility] (no beds). Pt will be evaluated by Behavioral Health in the morning.

## 2012-05-24 NOTE — ED Notes (Signed)
Pt states is feeling "antzy" and feeling "sick, Bad". Ativan given po per verbal order.

## 2012-05-24 NOTE — BHH Counselor (Signed)
Patient has been accepted at [facility] by Verne Spurr PA to the services of Dr. Koren Shiver, room 301.1

## 2012-05-25 ENCOUNTER — Inpatient Hospital Stay (HOSPITAL_COMMUNITY)
Admission: AD | Admit: 2012-05-25 | Discharge: 2012-05-29 | DRG: 897 | Disposition: A | Discharge status: Home / Self Care | Payer: 59 | Source: Other Acute Inpatient Hospital | Attending: Psychiatry | Admitting: Psychiatry

## 2012-05-25 DIAGNOSIS — F19939 Other psychoactive substance use, unspecified with withdrawal, unspecified: Principal | ICD-10-CM | POA: Diagnosis present

## 2012-05-25 DIAGNOSIS — F1020 Alcohol dependence, uncomplicated: Secondary | ICD-10-CM | POA: Diagnosis present

## 2012-05-25 DIAGNOSIS — K5900 Constipation, unspecified: Secondary | ICD-10-CM | POA: Diagnosis not present

## 2012-05-25 DIAGNOSIS — F3289 Other specified depressive episodes: Secondary | ICD-10-CM | POA: Diagnosis present

## 2012-05-25 DIAGNOSIS — F1120 Opioid dependence, uncomplicated: Secondary | ICD-10-CM | POA: Diagnosis present

## 2012-05-25 DIAGNOSIS — Z87442 Personal history of urinary calculi: Secondary | ICD-10-CM

## 2012-05-25 DIAGNOSIS — F329 Major depressive disorder, single episode, unspecified: Secondary | ICD-10-CM | POA: Diagnosis present

## 2012-05-25 LAB — CBC
HCT: 35 % — ABNORMAL LOW (ref 39.0–52.0)
Hemoglobin: 12 g/dL — ABNORMAL LOW (ref 13.0–17.0)
MCHC: 34.3 g/dL (ref 30.0–36.0)
RBC: 3.97 MIL/uL — ABNORMAL LOW (ref 4.22–5.81)
WBC: 9.2 10*3/uL (ref 4.0–10.5)

## 2012-05-25 LAB — COMPREHENSIVE METABOLIC PANEL
ALT: 50 U/L (ref 0–53)
Alkaline Phosphatase: 75 U/L (ref 39–117)
BUN: 22 mg/dL (ref 6–23)
Chloride: 103 mEq/L (ref 96–112)
GFR calc Af Amer: >90 mL/min (ref >90)
Glucose, Bld: 106 mg/dL — ABNORMAL HIGH (ref 70–99)
Potassium: 3.9 mEq/L (ref 3.5–5.1)
Sodium: 136 mEq/L (ref 135–145)
Total Bilirubin: 0.3 mg/dL (ref 0.3–1.2)
Total Protein: 7 g/dL (ref 6.0–8.3)

## 2012-05-25 LAB — RAPID URINE DRUG SCREEN, HOSP PERFORMED
Amphetamines: NOT DETECTED
Barbiturates: NOT DETECTED
Cocaine: POSITIVE — AB
Opiates: NOT DETECTED
Tetrahydrocannabinol: POSITIVE — AB

## 2012-05-25 MED ORDER — CLONIDINE HCL 0.1 MG PO TABS
0.1000 mg | ORAL_TABLET | Freq: Four times a day (QID) | ORAL | Status: AC
  Administered 2012-05-25 – 2012-05-27 (×5): 0.1 mg via ORAL
  Filled 2012-05-25 (×10): qty 1

## 2012-05-25 MED ORDER — CLONIDINE HCL 0.1 MG PO TABS
0.1000 mg | ORAL_TABLET | ORAL | Status: DC
  Administered 2012-05-28: 0.1 mg via ORAL
  Filled 2012-05-25 (×4): qty 1

## 2012-05-25 MED ORDER — LOPERAMIDE HCL 2 MG PO CAPS: 2.0000 mg | ORAL_CAPSULE | ORAL | Status: DC | PRN

## 2012-05-25 MED ORDER — LORAZEPAM 1 MG PO TABS
1.0000 mg | ORAL_TABLET | Freq: Once | ORAL | Status: AC
  Administered 2012-05-25: 1 mg via ORAL
  Filled 2012-05-25: qty 1

## 2012-05-25 MED ORDER — CLONIDINE HCL 0.1 MG PO TABS
0.1000 mg | ORAL_TABLET | Freq: Every day | ORAL | Status: DC
  Filled 2012-05-25: qty 1

## 2012-05-25 MED ORDER — METHOCARBAMOL 500 MG PO TABS
500.0000 mg | ORAL_TABLET | Freq: Three times a day (TID) | ORAL | Status: DC | PRN
  Administered 2012-05-26 – 2012-05-27 (×2): 500 mg via ORAL
  Filled 2012-05-25 (×2): qty 1

## 2012-05-25 MED ORDER — MAGNESIUM HYDROXIDE 400 MG/5ML PO SUSP
30.0000 mL | Freq: Every day | ORAL | Status: DC | PRN
  Administered 2012-05-28: 30 mL via ORAL

## 2012-05-25 MED ORDER — ZOLPIDEM TARTRATE 5 MG PO TABS
10.0000 mg | ORAL_TABLET | Freq: Once | ORAL | Status: AC
  Administered 2012-05-25: 10 mg via ORAL
  Filled 2012-05-25: qty 2

## 2012-05-25 MED ORDER — ACETAMINOPHEN 325 MG PO TABS: 650.0000 mg | ORAL_TABLET | Freq: Four times a day (QID) | ORAL | Status: DC | PRN

## 2012-05-25 MED ORDER — ONDANSETRON 4 MG PO TBDP: 4.0000 mg | ORAL_TABLET | Freq: Four times a day (QID) | ORAL | Status: DC | PRN

## 2012-05-25 MED ORDER — TRAZODONE HCL 100 MG PO TABS
100.0000 mg | ORAL_TABLET | Freq: Every evening | ORAL | Status: DC | PRN
  Administered 2012-05-25 – 2012-05-28 (×4): 100 mg via ORAL
  Filled 2012-05-25 (×4): qty 1

## 2012-05-25 MED ORDER — HYDROXYZINE HCL 25 MG PO TABS
25.0000 mg | ORAL_TABLET | Freq: Four times a day (QID) | ORAL | Status: DC | PRN
  Administered 2012-05-25 – 2012-05-29 (×3): 25 mg via ORAL

## 2012-05-25 MED ORDER — NICOTINE 21 MG/24HR TD PT24
21.0000 mg | MEDICATED_PATCH | Freq: Once | TRANSDERMAL | Status: DC
  Administered 2012-05-25: 21 mg via TRANSDERMAL
  Filled 2012-05-25: qty 1

## 2012-05-25 MED ORDER — NAPROXEN 500 MG PO TABS
500.0000 mg | ORAL_TABLET | Freq: Two times a day (BID) | ORAL | Status: DC | PRN
  Administered 2012-05-26 – 2012-05-29 (×5): 500 mg via ORAL
  Filled 2012-05-25 (×5): qty 1

## 2012-05-25 MED ORDER — ALUM & MAG HYDROXIDE-SIMETH 200-200-20 MG/5ML PO SUSP
30.0000 mL | ORAL | Status: DC | PRN
  Administered 2012-05-26 – 2012-05-27 (×3): 30 mL via ORAL

## 2012-05-25 MED ORDER — DICYCLOMINE HCL 20 MG PO TABS: 20.0000 mg | ORAL_TABLET | Freq: Four times a day (QID) | ORAL | Status: DC | PRN
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
## 2012-05-25 NOTE — BHH Counselor (Signed)
Patient has been accepted to [facility] by Serena Colonel NP to the services of Dr. Koren Shiver, bed # 301.1

## 2012-05-25 NOTE — ED Notes (Signed)
Devin Hodge with ACT team on way to work for papers for transfer

## 2012-05-25 NOTE — ED Notes (Signed)
Pt requesting medication for withdrawal.

## 2012-05-25 NOTE — BH Assessment (Signed)
[facility] Assessment Progress Note      05/25/2012  If patient is accepted at [facility], all that is needed to contact [facility] to update the authorization to begin on 05/25/2012 instead of on 05/24/2012. Authorization number is [id number].  Shon Baton, MSW, LCSW, LCASA, CSW-G

## 2012-05-25 NOTE — Tx Team (Signed)
Initial Interdisciplinary Treatment Plan  PATIENT STRENGTHS: (choose at least two) Active sense of humor Communication skills  PATIENT STRESSORS: Financial difficulties Occupational concerns Substance abuse   PROBLEM LIST: Problem List/Patient Goals Date to be addressed Date deferred Reason deferred Estimated date of resolution  Substance Abuse 05/25/2012    D/C  Lack of housing 05/25/2012    unknown  Depression 05/25/2012    D/C                                       DISCHARGE CRITERIA:  Adequate post-discharge living arrangements Improved stabilization in mood, thinking, and/or behavior Motivation to continue treatment in a less acute level of care Withdrawal symptoms are absent or subacute and managed without 24-hour nursing intervention  PRELIMINARY DISCHARGE PLAN: Attend 12-step recovery group Return to previous living arrangement Referrals indicated:  long term SA tx  PATIENT/FAMIILY INVOLVEMENT: This treatment plan has been presented to and reviewed with the patient, Devin Hodge.  The patient and family have been given the opportunity to ask questions and make suggestions.  Beverley Sherrard 05/25/2012, 5:18 PM

## 2012-05-25 NOTE — ED Notes (Signed)
Lunch given to pt

## 2012-05-25 NOTE — BH Assessment (Signed)
[facility] Assessment Progress Note    05/25/2012  Patient has been accepted to [facility], by Alessandra Grout, PA to Dr. Kristen Loader; Room 301-bed 1. Contacted [facility] to update the authorization; awaiting call back. Patient is under IVC and will be transported by [facility].    1:00 PM Burna Mortimer from [facility] called back;  She stated that Dois Davenport in [facility] updated the authorization dates to reflect today, 05/25/2012 thru 05/27/2012. Patient will keep previous authorization number.   Shon Baton, MSW, LCSW, LCASA, CSW-G

## 2012-05-25 NOTE — BH Assessment (Signed)
Assessment Note   Devin Hodge is an 41 y.o. male. He was just here in the Emergency Department today. He had a bed for detox at [facility]. He was told [facility] was on the way to get him to take him to treatment, but he decided to leave AMA. He has returned tonight stating he is feeling suicidal and may overdose. He reports that the just couldn't stay-that he was getting very anxious and that is why he left. However, he has been out abusing alcohol and drugs today; he says he drank a couple of beers today and took for opiate pills. He is calm, cooperative. He was confronted by staff concerning leaving AMA and was told he would have to wait now for a bed; as he lost his bed at [facility]. Dr. Hyacinth Meeker has IVC'd patient so he can not leave now. Fish farm manager stationed in ED states that patient has a warrant for his arrest; that was issued by Engineer, drilling for probation violation. He failed to mention this to assessor.   Alcohol, first use at 16, will use between 4-12 beers per day. Last use today; "a couple of beers." Narcotics, first use at 18, 4 roxycodones per day. Last use was today-4 pills per patient. Cocaine, first use at 18, is shooting it and last use was yesterday, using a gram, about 3-4 times/week. Cannabis, first use at 13, using it daily; one joint per day, on average.  Patient denies HI, hallucinations; no delusions noted. Explained that he may not be able to get a bed and it will depend on availability and if the doctor feels he is a candidate.  Axis I: Substance Abuse Axis II: Deferred Axis III: hx of Kidney Stone Axis IV: stressors: financial, unemployed, limited social support; current warrant due to probation violation Axis V: 38   Past Medical History:  Past Medical History  Diagnosis Date  . Kidney stone   . Status post alcohol detoxification     Past Surgical History  Procedure Date  . Pleural scarification     Family History: History  reviewed. No pertinent family history.  Social History:  reports that he has been smoking Cigarettes.  He has been smoking about .5 packs per day. He does not have any smokeless tobacco history on file. He reports that he drinks alcohol. He reports that he uses illicit drugs (Cocaine).  Additional Social History:     CIWA: CIWA-Ar BP: 108/81 mmHg Pulse Rate: 79  COWS:    Allergies:  Allergies  Allergen Reactions  . Sulfa Antibiotics Hives    Home Medications:  (Not in a hospital admission)  OB/GYN Status:  No LMP for male patient.  General Assessment Data Location of Assessment: [facility] ACT Assessment: Yes Living Arrangements: Alone Can pt return to current living arrangement?: Yes Admission Status: Involuntary Is patient capable of signing voluntary admission?: Yes Transfer from: Acute Hospital Referral Source: MD     Risk to self Suicidal Ideation: Yes-Currently Present Suicidal Intent: Yes-Currently Present Is patient at risk for suicide?: Yes Suicidal Plan?: No-Not Currently/Within Last 6 Months Access to Means: Yes Specify Access to Suicidal Means: pills What has been your use of drugs/alcohol within the last 12 months?: chronic Previous Attempts/Gestures: No Triggers for Past Attempts: None known Intentional Self Injurious Behavior: None Family Suicide History: Unknown Recent stressful life event(s): Job Loss;Financial Problems;Legal Issues Persecutory voices/beliefs?: No Depression Symptoms: Loss of interest in usual pleasures;Feeling worthless/self pity;Feeling angry/irritable Substance abuse history and/or  treatment for substance abuse?: Yes Suicide prevention information given to non-admitted patients: Not applicable  Risk to Others Homicidal Ideation: No Thoughts of Harm to Others: No Current Homicidal Intent: No Current Homicidal Plan: No Access to Homicidal Means: No History of harm to others?: No Assessment of Violence: None Noted Does patient  have access to weapons?: No Criminal Charges Pending?: Yes (Per [employer], has warrant for Prob. violation) Describe Pending Criminal Charges:  (warrant for arrest-probation violation) Does patient have a court date: No  Psychosis Hallucinations: None noted Delusions: None noted  Mental Status Report Appear/Hygiene: Disheveled Eye Contact: Fair Motor Activity: Unremarkable Speech: Logical/coherent Level of Consciousness: Quiet/awake Mood: Preoccupied Affect: Appropriate to circumstance Anxiety Level: Minimal Thought Processes: Coherent Judgement: Unimpaired Orientation: Person;Place;Time;Situation;Appropriate for developmental age Obsessive Compulsive Thoughts/Behaviors: None  Cognitive Functioning Concentration: Normal Memory: Recent Intact IQ: Average Insight: Poor Impulse Control: Poor Appetite: Fair Sleep: No Change Total Hours of Sleep: 6  Vegetative Symptoms: None  ADLScreening [facility] Assessment Services) Patient's cognitive ability adequate to safely complete daily activities?: Yes Patient able to express need for assistance with ADLs?: Yes Independently performs ADLs?: Yes (appropriate for developmental age)  Abuse/Neglect [facility]) Physical Abuse: Denies Verbal Abuse: Denies Sexual Abuse: Denies  Prior Inpatient Therapy Prior Inpatient Therapy: Yes Prior Therapy Dates:  (1991) Prior Therapy Facilty/Provider(s): [facility] Reason for Treatment: Detox, rehab  Prior Outpatient Therapy Prior Outpatient Therapy: No  ADL Screening (condition at time of admission) Patient's cognitive ability adequate to safely complete daily activities?: Yes Patient able to express need for assistance with ADLs?: Yes Independently performs ADLs?: Yes (appropriate for developmental age)       Abuse/Neglect Assessment (Assessment to be complete while patient is alone) Physical Abuse: Denies Verbal Abuse: Denies Sexual Abuse: Denies Values / Beliefs Cultural Requests During  Hospitalization: None Spiritual Requests During Hospitalization: None        Additional Information 1:1 In Past 12 Months?: No CIRT Risk: No Elopement Risk: No Does patient have medical clearance?: Yes     Disposition:  Disposition Disposition of Patient: Inpatient treatment program Type of inpatient treatment program: Adult Patient referred to: Other (Comment)  On Site Evaluation by:  Dr. Hyacinth Meeker Reviewed with Physician:  Dr. Hyacinth Meeker  There are no beds available at [facility]. [facility] has declined the Patient. [facility] not [facility] any beds. Referred to [facility]; awaiting call to see if they will consider taking the patient again. If patient is not accepted by [facility], it is recommended the patient receive tele-psych consult and follow recommendations of that practitioner.    Shon Baton H 05/25/2012 12:52 AM

## 2012-05-25 NOTE — ED Notes (Signed)
Patient resting in bed with eyes closed; equal rise and fall of chest noted. 

## 2012-05-25 NOTE — ED Provider Notes (Signed)
History     CSN: [id number]  Arrival date & time 05/24/12  2329   First MD Initiated Contact with Patient 05/24/12 2357      Chief Complaint  Patient presents with  . Suicidal    (Consider location/radiation/quality/duration/timing/severity/associated sxs/prior treatment) HPI Comments: 41 year old male who presents with a complaint of suicidal thoughts area and he states that overall these complaints, because he has substance abuse problems, he was at the hospital here 24 hours ago for the same complaint but left AGAINST MEDICAL ADVICE in the morning because he couldn't take the withdrawal symptoms and wanted to go take alcohol and opiate medications. He presents requesting recurrent placement into detox facility. He denies having a plan of suicidality, he has not attempted to hurt himself and admits to taking several pain pills and drinking 2 beers.  The history is provided by the patient and medical records.    Past Medical History  Diagnosis Date  . Kidney stone   . Status post alcohol detoxification     Past Surgical History  Procedure Date  . Pleural scarification     History reviewed. No pertinent family history.  History  Substance Use Topics  . Smoking status: Current Everyday Smoker -- 0.5 packs/day    Types: Cigarettes  . Smokeless tobacco: Not on file  . Alcohol Use: Yes      Review of Systems  All other systems reviewed and are negative.    Allergies  Sulfa antibiotics  Home Medications   No current outpatient prescriptions on file.  BP 118/79  Pulse 84  Temp 98.2 F (36.8 C)  Resp 18  Ht 6' (1.829 m)  Wt 155 lb (70.308 kg)  BMI 21.02 kg/m2  SpO2 98%  Physical Exam  Nursing note and vitals reviewed. Constitutional: He appears well-developed and well-nourished. No distress.  HENT:  Head: Normocephalic and atraumatic.  Mouth/Throat: Oropharynx is clear and moist. No oropharyngeal exudate.  Eyes: Conjunctivae and EOM are normal. Pupils  are equal, round, and reactive to light. Right eye exhibits no discharge. Left eye exhibits no discharge. No scleral icterus.  Neck: Normal range of motion. Neck supple. No JVD present. No thyromegaly present.  Cardiovascular: Normal rate, regular rhythm, normal heart sounds and intact distal pulses.  Exam reveals no gallop and no friction rub.   No murmur heard. Pulmonary/Chest: Effort normal and breath sounds normal. No respiratory distress. He has no wheezes. He has no rales.  Abdominal: Soft. Bowel sounds are normal. He exhibits no distension and no mass. There is no tenderness.  Musculoskeletal: Normal range of motion. He exhibits no edema and no tenderness.  Lymphadenopathy:    He has no cervical adenopathy.  Neurological: He is alert. Coordination normal.  Skin: Skin is warm and dry. No rash noted. No erythema.  Psychiatric: He has a normal mood and affect. His behavior is normal.       Passive suicidal ideation    ED Course  Procedures (including critical care time)  Labs Reviewed  CBC - Abnormal; Notable for the following:    RBC 3.97 (*)     Hemoglobin 12.0 (*)     HCT 35.0 (*)     All other components within normal limits  COMPREHENSIVE METABOLIC PANEL - Abnormal; Notable for the following:    Glucose, Bld 106 (*)     All other components within normal limits  URINE RAPID DRUG SCREEN (HOSP PERFORMED) - Abnormal; Notable for the following:    Cocaine POSITIVE (*)  Tetrahydrocannabinol POSITIVE (*)     All other components within normal limits  ETHANOL  LAB REPORT - SCANNED   No results found.   1. Suicidal ideation       MDM  Normal vital signs, no active signs of withdrawal, no tremor, normal mental status, diaphoresis, normal bowel sounds and soft abdomen. At this time we'll repeat behavioral health evaluation.  Change of shift, care signed out to Dr. Manus Gunning.      Vida Roller, MD 05/27/12 [phone]

## 2012-05-25 NOTE — ED Notes (Signed)
Called [facility] to transport Pt to [facility] for transport to [facility].

## 2012-05-26 DIAGNOSIS — F1020 Alcohol dependence, uncomplicated: Secondary | ICD-10-CM | POA: Diagnosis present

## 2012-05-26 DIAGNOSIS — F1120 Opioid dependence, uncomplicated: Secondary | ICD-10-CM | POA: Diagnosis present

## 2012-05-26 MED ORDER — GABAPENTIN 100 MG PO CAPS
200.0000 mg | ORAL_CAPSULE | Freq: Three times a day (TID) | ORAL | Status: DC
  Administered 2012-05-26 – 2012-05-29 (×9): 200 mg via ORAL
  Filled 2012-05-26 (×12): qty 2

## 2012-05-26 MED ORDER — CHLORDIAZEPOXIDE HCL 25 MG PO CAPS: 25.0000 mg | ORAL_CAPSULE | Freq: Every day | ORAL | Status: DC

## 2012-05-26 MED ORDER — NICOTINE 21 MG/24HR TD PT24
21.0000 mg | MEDICATED_PATCH | Freq: Every day | TRANSDERMAL | Status: DC
  Administered 2012-05-26 – 2012-05-29 (×4): 21 mg via TRANSDERMAL
  Filled 2012-05-26 (×6): qty 1

## 2012-05-26 MED ORDER — CITALOPRAM HYDROBROMIDE 20 MG PO TABS
20.0000 mg | ORAL_TABLET | Freq: Every day | ORAL | Status: DC
  Administered 2012-05-26 – 2012-05-29 (×4): 20 mg via ORAL
  Filled 2012-05-26 (×6): qty 1

## 2012-05-26 MED ORDER — CHLORDIAZEPOXIDE HCL 25 MG PO CAPS
25.0000 mg | ORAL_CAPSULE | Freq: Three times a day (TID) | ORAL | Status: AC
  Administered 2012-05-27 (×2): 25 mg via ORAL
  Filled 2012-05-26 (×2): qty 1

## 2012-05-26 MED ORDER — CHLORDIAZEPOXIDE HCL 25 MG PO CAPS
25.0000 mg | ORAL_CAPSULE | Freq: Four times a day (QID) | ORAL | Status: AC
  Administered 2012-05-26 (×2): 25 mg via ORAL
  Filled 2012-05-26 (×3): qty 1

## 2012-05-26 MED ORDER — CHLORDIAZEPOXIDE HCL 25 MG PO CAPS
25.0000 mg | ORAL_CAPSULE | ORAL | Status: DC
  Administered 2012-05-28: 25 mg via ORAL
  Filled 2012-05-26: qty 1

## 2012-05-26 NOTE — BHH Counselor (Signed)
Adult Comprehensive Assessment  Patient ID: Devin Hodge, male   DOB: [birth year], 41 y.o.   MRN: [id number]  Information Source: Information source: Patient  Current Stressors:  Educational / Learning stressors: GED Employment / Job issues: unemployed Family Relationships: limited family support Surveyor, quantity / Lack of resources (include bankruptcy): no income Housing / Lack of housing: homeless, Physical health (include injuries & life threatening diseases): no issues reported Social relationships: lacks social support Substance abuse: pail pills, beer, liquor, cocaine, THC Bereavement / Loss: relationship with children  Living/Environment/Situation:  Living Arrangements:  (homeless) How long has patient lived in current situation?: 1 month What is atmosphere in current home: Dangerous  Family History:  Marital status: Divorced Divorced, when?: 1997 What types of issues is patient dealing with in the relationship?: n/a Does patient have children?: Yes How many children?: 6  (4 girls, 2 boys) How is patient's relationship with their children?: fine,  Childhood History:  By whom was/is the patient raised?: Mother Additional childhood history information: did not have a relationship with father Description of patient's relationship with caregiver when they were a child: okay Patient's description of current relationship with people who raised him/her: okay Does patient have siblings?: Yes Number of Siblings: 2  (sisters) Description of patient's current relationship with siblings: they don't get along Did patient suffer any verbal/emotional/physical/sexual abuse as a child?: No Did patient suffer from severe childhood neglect?: No Has patient ever been sexually abused/assaulted/raped as an adolescent or adult?: No Was the patient ever a victim of a crime or a disaster?: No Witnessed domestic violence?: No Has patient been effected by domestic violence as an adult?:  No  Education:  Highest grade of school patient has completed: GED Currently a Consulting civil engineer?: No Learning disability?: No  Employment/Work Situation:   Employment situation: Unemployed Patient's job has been impacted by current illness: Yes Describe how patient's job has been implacted: substance use What is the longest time patient has a held a job?: 12 years 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 Has patient ever been in the [employer]?: No Has patient ever served in Buyer, retail?: No  Financial Resources:   Surveyor, quantity resources: No income Does patient have a Lawyer or guardian?: No  Alcohol/Substance Abuse:   What has been your use of drugs/alcohol within the last 12 months?: pain pills-all I can find, berr, liquor daily, cocaine, THC If attempted suicide, did drugs/alcohol play a role in this?: No Alcohol/Substance Abuse Treatment Hx: Past Tx, Inpatient If yes, describe treatment: [facility]-99, [facility]=last year Has alcohol/substance abuse ever caused legal problems?: No  Social Support System:   Conservation officer, nature Support System: Fair Museum/gallery exhibitions officer System: mother Type of faith/religion: none How does patient's faith help to cope with current illness?: n/a  Leisure/Recreation:   Leisure and Hobbies: used to draw,  Strengths/Needs:   What things does the patient do well?: nothing In what areas does patient struggle / problems for patient: substance abuse  Discharge Plan:   Does patient have access to transportation?: No Plan for no access to transportation at discharge: unsure where he is going Will patient be returning to same living situation after discharge?: No Plan for living situation after discharge: patient would like a 14 or 30 day program Currently receiving community mental health services: No If no, would patient like referral for services when discharged?: No Does patient have financial barriers related to discharge  medications?: Yes Patient description of barriers related to discharge medications: no income  Summary/Recommendations:   Summary and Recommendations (to be completed by the evaluator): Patient is a 41 year old white male with diagnosis of Polysubstance Dependency. He was seeking detox from alcohol, and opioids. Patient would benefit from crisis stabilization, detox , SA education and referral to treatment program.  Tatelyn Vanhecke, Aram Beecham. 05/26/2012

## 2012-05-26 NOTE — H&P (Signed)
Psychiatric Admission Assessment Adult  Patient Identification:  Devin Hodge Date of Evaluation:  05/26/2012 Chief Complaint:  Polysubstance Dependence History of Present Illness: This is a voluntary admission for this 41 yr old DWM who presented to the [facility] requesting detox from alcohol and opiates, and cocaine.  He notes that he was drinking 1 pt to 1/5th a day for the last 2 months, as well as 3-5 Roxicodones, Opanas or Percocet a day.  He is injecting them as well. He has not detoxed in 20 years, and reports that was from ETOH at that time, but he has "always used opiates."  He denies other drugs of abuse. Mood Symptoms:  Depression, Depression Symptoms:  depressed mood, (Hypo) Manic Symptoms:  none Anxiety Symptoms:  At a 5 Psychotic Symptoms:  none  PTSD Symptoms: none  Past Psychiatric History: Diagnosis: Opiate dependence, alcohol dependence  Hospitalizations: none  Outpatient Care:  none  Substance Abuse Care:  none  Self-Mutilation:  Suicidal Attempts:  none  Violent Behaviors:  none   Past Medical History:   Past Medical History  Diagnosis Date  . Kidney stone   . Status post alcohol detoxification    None. Allergies:   Allergies  Allergen Reactions  . Sulfa Antibiotics Hives   PTA Medications: Prescriptions prior to admission  Medication Sig Dispense Refill  . ibuprofen (ADVIL,MOTRIN) 200 MG tablet Take 800 mg by mouth as needed. For pain        Previous Psychotropic Medications:  Medication/Dose                 Substance Abuse History in the last 12 months: Substance Age of 1st Use Last Use Amount Specific Type  Nicotine      Alcohol      Cannabis      Opiates      Cocaine      Methamphetamines      LSD      Ecstasy      Benzodiazepines      Caffeine      Inhalants      Others:                         Consequences of Substance Abuse: Medical Consequences:  at risk for infectious disease including HIV, Hep C  Social  History: Current Place of Residence:  Manufacturing engineer of Birth:   Family Members: Marital Status:  Divorced Children:  Sons:  Daughters: Relationships: Education:  GED Educational Problems/Performance: Religious Beliefs/Practices: History of Abuse (Emotional/Phsycial/Sexual) Occupational Experiences; Military History:  None. Legal History: Hobbies/Interests:  Family History:  No family history on file. ROS: Negative with the exception of the HPI. PE: Completed by the MD in the ED.  Mental Status Examination/Evaluation: Objective:  Appearance: Casual  Eye Contact::  Good  Speech:  Clear and Coherent  Volume:  Normal  Mood:  Euthymic  Affect:  Congruent  Thought Process:  Coherent, Goal Directed, Intact and Linear  Orientation:  Full  Thought Content:  WDL  Suicidal Thoughts:  No  Homicidal Thoughts:  No  Memory:  Immediate;   Fair Recent;   Fair Remote;   Fair  Judgement:  Intact  Insight:  Fair  Psychomotor Activity:  Normal  Concentration:  Fair  Recall:  Fair  Akathisia:  No  Handed:  Right  AIMS (if indicated):     Assets:  Communication Skills Desire for Improvement Housing Physical Health Social Support Vocational/Educational  Sleep:  Number  of Hours: 6.75     Laboratory/X-Ray Psychological Evaluation(s)  UDS+Cocaine, Opiates, THC BAL- < day of admission  CMP-unremarkable  CBC- RBC-3.97             WBC-9.2             hgb 12.0             hct--35.0    Assessment:    AXIS I:  Opiate dependence, alcohol dependence. AXIS II:  Deferred AXIS III:   Past Medical History  Diagnosis Date  . Kidney stone   . Status post alcohol detoxification    AXIS IV:  occupational problems, problems related to social environment, problems with access to health care services and problems with primary support group AXIS V:  51-60 moderate symptoms  Treatment Plan/Recommendations: 1. Admit for crisis management, stabilization and detox. 2. Medication  management to reduce current symptoms to base line and improve the patient's overall level of functioning 3. Treat health problems as indicated. 4. Develop treatment plan to decrease risk of relapse upon discharge and the need for readmission. 5. Psycho-social education regarding relapse prevention and self care. 6. Health care follow up as needed for medical problems. 7. Restart home medications where appropriate.   Treatment Plan: 1. Librium detox protocol as written. 2. HIV, Hep panel testing. 3. Treat medical problems as indicated.  Current Medications:  Current Facility-Administered Medications  Medication Dose Route Frequency Provider Last Rate Last Dose  . acetaminophen (TYLENOL) tablet 650 mg  650 mg Oral Q6H PRN Sanjuana Kava, NP      . alum & mag hydroxide-simeth (MAALOX/MYLANTA) 200-200-20 MG/5ML suspension 30 mL  30 mL Oral Q4H PRN Sanjuana Kava, NP      . chlordiazePOXIDE (LIBRIUM) capsule 25 mg  25 mg Oral QID Alyson Kuroski-Mazzei, DO       Followed by  . chlordiazePOXIDE (LIBRIUM) capsule 25 mg  25 mg Oral TID Alyson Kuroski-Mazzei, DO       Followed by  . chlordiazePOXIDE (LIBRIUM) capsule 25 mg  25 mg Oral BH-qamhs Alyson Kuroski-Mazzei, DO       Followed by  . chlordiazePOXIDE (LIBRIUM) capsule 25 mg  25 mg Oral Daily Alyson Kuroski-Mazzei, DO      . citalopram (CELEXA) tablet 20 mg  20 mg Oral Daily Alyson Kuroski-Mazzei, DO      . cloNIDine (CATAPRES) tablet 0.1 mg  0.1 mg Oral QID Mickeal Skinner, MD   0.1 mg at 05/26/12 4098   Followed by  . cloNIDine (CATAPRES) tablet 0.1 mg  0.1 mg Oral BH-qamhs Mickeal Skinner, MD       Followed by  . cloNIDine (CATAPRES) tablet 0.1 mg  0.1 mg Oral QAC breakfast Mickeal Skinner, MD      . dicyclomine (BENTYL) tablet 20 mg  20 mg Oral Q6H PRN Mickeal Skinner, MD      . gabapentin (NEURONTIN) capsule 200 mg  200 mg Oral TID Alyson Kuroski-Mazzei, DO      . hydrOXYzine (ATARAX/VISTARIL) tablet 25 mg  25 mg Oral Q6H PRN Mickeal Skinner, MD   25 mg at 05/25/12 1809  . loperamide (IMODIUM) capsule 2-4 mg  2-4 mg Oral PRN Mickeal Skinner, MD      . magnesium hydroxide (MILK OF MAGNESIA) suspension 30 mL  30 mL Oral Daily PRN Sanjuana Kava, NP      . methocarbamol (ROBAXIN) tablet 500 mg  500 mg Oral Q8H PRN Mickeal Skinner, MD   500 mg at  05/26/12 1301  . naproxen (NAPROSYN) tablet 500 mg  500 mg Oral BID PRN Mickeal Skinner, MD   500 mg at 05/26/12 1301  . nicotine (NICODERM CQ - dosed in mg/24 hours) patch 21 mg  21 mg Transdermal Q0600 Alyson Kuroski-Mazzei, DO   21 mg at 05/26/12 1300  . ondansetron (ZOFRAN-ODT) disintegrating tablet 4 mg  4 mg Oral Q6H PRN Mickeal Skinner, MD      . traZODone (DESYREL) tablet 100 mg  100 mg Oral QHS PRN Mickeal Skinner, MD   100 mg at 05/25/12 2149   Facility-Administered Medications Ordered in Other Encounters  Medication Dose Route Frequency Provider Last Rate Last Dose  . DISCONTD: nicotine (NICODERM CQ - dosed in mg/24 hours) patch 21 mg  21 mg Transdermal Once Vida Roller, MD   21 mg at 05/25/12 0205    Observation Level/Precautions:  Detox  Laboratory:  HIV, Acute Hep panel  Psychotherapy:    Medications:    Routine PRN Medications:  Yes  Consultations:    Discharge Concerns:    Other:     Dakarai Mcglocklin 9/3/20133:26 PM

## 2012-05-26 NOTE — H&P (Signed)
Pt seen and evaluated upon admission.  Completed Admission Suicide Risk Assessment.  See orders.  Pt agreeable with plan.  Discussed with team.   

## 2012-05-26 NOTE — Progress Notes (Signed)
Psychoeducational Group Note  Date:  05/26/2012 Time:  1000  Group Topic/Focus:  Recovery Goals:   The focus of this group is to identify appropriate goals for recovery and establish a plan to achieve them.  Participation Level: Did Not Attend  Participation Quality:  Not Applicable  Affect:  Not Applicable  Cognitive:  Not Applicable  Insight:  Not Applicable  Engagement in Group: Not Applicable  Additional Comments:  Pt did not attend group. Pt remained in bed.  Karleen Hampshire Brittini 05/26/2012, 1:50 PM

## 2012-05-26 NOTE — Progress Notes (Signed)
Pt reported his sleep as fair appetite improving energy low and ability to pay attention as poor.  He rated his depression and hopelessness both a 5 on his self-inventory.  He denied any anxiety or symptoms of withdrawal.  He denied any S/H ideation or A/V hallucinations.  He still c/o stomach cramps.  He c/o of dizziness and he was educated on getting up slowly and he voiced understanding.  He is wanting to go straight from here to a facility to continue his rehab.

## 2012-05-26 NOTE — Progress Notes (Addendum)
[facility] Group Notes:  (Counselor/Nursing/MHT/Case Management/Adjunct)  05/26/2012 3:19 PM  Type of Therapy:  Psychoeducational Skills  Participation Level:  Did Not Attend   Summary of Progress/Problems: Wen did not attend Psychoeducational group that focused on using quality time with support systems/individuals to engage in healthy coping skills.    Wandra Scot 05/26/2012, 3:19 PM
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
## 2012-05-26 NOTE — Treatment Plan (Signed)
Interdisciplinary Treatment Plan Update (Adult)  Date: 05/26/2012  Time Reviewed: 11:12 AM   Progress in Treatment: Attending groups: Yes Participating in groups: Yes Taking medication as prescribed: Yes Tolerating medication: Yes   Family/Significant other contact made: No  Patient understands diagnosis:  Yes  As evidenced by asking for help with depression, withdrawal symptoms Discussing patient identified problems/goals with staff:  Yes  See below Medical problems stabilized or resolved:  Yes Denies suicidal/homicidal ideation: Yes  In tx team Issues/concerns per patient self-inventory:  Not filled out Other:  New problem(s) identified: N/A  Reason for Continuation of Hospitalization: Withdrawal symptoms  Interventions implemented related to continuation of hospitalization:  Librium, clonodine taper  Encourage group attendance and participation  Additional comments:  Estimated length of stay:2-3 days  Discharge Plan:see below  New goal(s): N/A  Review of initial/current patient goals per problem list:   1.  Goal(s): Safely detox from alcohol, opiates  Met:  No  Target date:9/6  As evidenced by: stable vitals, no withdrawal symptoms  2.  Goal (s): Stabilize mood  Met:  No  Target date:9/6  As evidenced by: Devin Hodge will rate his depression and anxiety at 4 or less  3.  Goal(s):Identify comprehensive sobriety plan  Met:  No  Target date:9/3  As evidenced by:Devin Hodge wants to get into rehab from here  4.  Goal(s):  Met:  No  Target date:  As evidenced by:  Attendees: Patient:  Devin Hodge 05/26/2012 11:12 AM  Family:     Physician:  Lupe Carney 05/26/2012 11:12 AM   Nursing:  Robbie Louis  05/26/2012 11:12 AM   Case Manager:  Richelle Ito, LCSW 05/26/2012 11:12 AM   Counselor:  Ronda Fairly, LCSWA 05/26/2012 11:12 AM   Other:     Other:     Other:     Other:      Scribe for Treatment Team:   Daryel Gerald B, 05/26/2012 11:12 AM

## 2012-05-26 NOTE — Progress Notes (Addendum)
[facility] Group Notes:  (Counselor/Nursing/MHT/Case Management/Adjunct)   Type of Therapy:  Group Therapy 1:15 - 2:30 on 05/26/2012  Participation Level:  Did Not Attend   Clide Dales 05/27/2012, 10:46 AM

## 2012-05-27 DIAGNOSIS — F1020 Alcohol dependence, uncomplicated: Secondary | ICD-10-CM

## 2012-05-27 DIAGNOSIS — F1120 Opioid dependence, uncomplicated: Secondary | ICD-10-CM

## 2012-05-27 LAB — HEPATITIS PANEL, ACUTE
HCV Ab: REACTIVE — AB
Hep A IgM: NEGATIVE

## 2012-05-27 NOTE — Progress Notes (Signed)
Brief Nutrition Note  Reason: Nutrition Risk for unintentional weight loss  Patient with minimal interest in speaking with RD. Patient reported his appetite and intake are well. He reported PTA he was not eating because he was busy.    Wt Readings from Last 10 Encounters:  05/25/12 149 lb (67.586 kg)  05/24/12 155 lb (70.308 kg)  05/23/12 155 lb (70.308 kg)  05/14/12 155 lb (70.308 kg)  04/29/12 165 lb (74.844 kg)  04/28/12 170 lb (77.111 kg)  03/19/12 170 lb (77.111 kg)  03/15/12 170 lb (77.111 kg)  01/20/12 170 lb (77.111 kg)  11/02/11 180 lb (81.647 kg)   BMI: 21.3 kg/m^2  I have encouraged the patient to eat regular meals daily. I have educated the patient on good sources of calories and protein. I provided suggestions for PO intake when patient is busy. The patient was without any nutrition questions and verbalized understanding of the nutrition information provided.   RD available for nutrition needs.   Iven Finn [facility] [phone]

## 2012-05-27 NOTE — Progress Notes (Signed)
Patient ID: Devin Hodge, male   DOB: [birth year], 41 y.o.   MRN: [id number] D: Pt. In dayroom playing cards, interacting with peers, pt. Appears focused and relaxed. Pt. Reports "feel good since I'm not taking all that medicine" "it had me sleeping all the time, I feel better drinking water and moving around" Pt. Reports "I stayed clean for two years, going to church and just not using" Pt. Reports he will get a sponsor this time.  A: pt. Encourage to use church as support system and NA. Staff will monitor q65min for safety. Pt. Encouraged to attend group. R: Pt. Is safe on the unit. Pt. Attends group.

## 2012-05-27 NOTE — Progress Notes (Signed)
[facility] Group Notes:  (Counselor/Nursing/MHT/Case Management/Adjunct)  05/27/2012 4:21 PM  Type of Therapy:  Group Therapy at 1:15 to 2:30 PM  Participation Level:  Active  Participation Quality:  Appropriate, Attentive and Sharing  Affect:  Flat  Cognitive:  Alert and Oriented  Insight:  Limited  Engagement in Group:  Good  Engagement in Therapy:  Good  Modes of Intervention:  Clarification, Socialization and Support  Summary of Progress/Problems: The focus of this group session was to process how we deal with difficult emotions and share with others the patterns that play out when we are reacting to the emotion verses the situation.  [name] shared that it is very difficult for him to trust others much less himself.      Clide Dales 05/27/2012, 4:21 PM

## 2012-05-27 NOTE — Progress Notes (Signed)
Read and reviewed. 

## 2012-05-27 NOTE — Discharge Planning (Signed)
Wayne did not attend AM group.  When I saw him after lunch, He was lying on a love seat under the covers in the day room.  Said he felt "OK."  Asked about [facility] and [facility].  Told him I had faxed [facility] referral yesterday and left a message this AM for.  Also told him I could not contact [facility] until the Dr cleared him for transfer.

## 2012-05-27 NOTE — Progress Notes (Signed)
Psychoeducational Group Note  Date:  05/27/2012 Time:  2000 Group Topic/Focus:  AA group  Participation Level:  Active  Participation Quality:  Appropriate  Affect:  Appropriate  Cognitive:  Appropriate  Insight:  Good  Engagement in Group:  Good  Additional Comments:    Gwenevere Ghazi Patience 05/27/2012, 8:56 PM

## 2012-05-27 NOTE — Progress Notes (Signed)
Psychoeducational Group Note  Date:  05/27/2012 Time:  1100  Group Topic/Focus:  Personal Choices and Values:   The focus of this group is to help patients assess and explore the importance of values in their lives, how their values affect their decisions, how they express their values and what opposes their expression.  Participation Level:  Active  Participation Quality:  Appropriate, Attentive and Sharing  Affect:  Appropriate  Cognitive:  Alert and Appropriate  Insight:  Good  Engagement in Group:  Good  Additional Comments:  Pt was appropriate and sharing while attending group. Pt stated that having self-control and being a good parent are values that are really important to him. He also stated that if he could simply say "NO" to drugs then he will be ok.   Sharyn Lull 05/27/2012, 12:56 PM

## 2012-05-27 NOTE — Progress Notes (Addendum)
Pt reports sleep poor up and down all night however, staff documented pt slept 6.75 hours.  He reports appetite is improving energy low and ability to pay attention as poor.  He rated his depression a 5 hopelessness a 4 and stated,"not really" when asked about his anxiety.  Still c/o left leg pain and was given robaxin and naproxen at 0819 which did help relieve his pain. He is hoping to go to [facility] and has a referral into [facility] too.  He denies any S/H ideation or A/V hallucinations today.

## 2012-05-27 NOTE — Progress Notes (Signed)
[facility] In Patient Progress Note  05/27/2012 12:38 PM Devin Hodge January 15, 1971 [id number] 2 Diagnosis:  Axis I: Alcohol Use Disorder; Opioid Use Disorder; Depressive Disorder Unspecified; CBP  ADL's:  Intact  Sleep:  No due to withdrawal symptoms  Appetite:OK  Suicidal Ideation: No suicidal ideation, no plan, no intent, no means.  Homicidal Ideation:  No homicidal ideation, no plan, no intent, no means.  Subjective:  Devin Hodge is resting in bed this morning and says he does not feel well due to his withdrawal symptoms. He reports all over body aches, diarrhea, sweats, chills and abdominal pain, dizziness.   height is 5\' 10"  (1.778 m) and weight is 67.586 kg (149 lb). His oral temperature is 97.5 F (36.4 C). His blood pressure is 98/64 and his pulse is 69. His respiration is 18 and oxygen saturation is 99%.   Objective: His speech is clear and goal directed, he does appear to be in withdrawal but does not appear toxic.  He is alert and oriented.   Mental Status: awake Level of Consciousness:  Alert Orientation: x 3 General Appearance : casual, in bed, sick but not toxic Behavior: cooperative Eye Contact:  Fair Motor Behavior:  Normal Speech:  Normal Mood:  Depressed and Irritable Affect:  congruent Anxiety Level:  None Thought Process:  Coherent Thought Content:  WNL Perception:  Normal Judgment:  Fair Insight:  Present Cognition:  At least average Sleep:  Number of Hours: 6.75   Lab Results:  Results for orders placed during the Hodge encounter of 05/25/12 (from the past 48 hour(s))  HIV ANTIBODY (ROUTINE TESTING)     Status: Normal   Collection Time   05/26/12  7:47 PM      Component Value Range Comment   HIV NON REACTIVE  NON REACTIVE   HEPATITIS PANEL, ACUTE     Status: Abnormal (Preliminary result)   Collection Time   05/26/12  7:47 PM      Component Value Range Comment   Hepatitis B Surface Ag NEGATIVE  NEGATIVE    HCV Ab Reactive (*) NEGATIVE    Hep A IgM PENDING   NEGATIVE    Hep B C IgM PENDING  NEGATIVE    Labs are reviewed. Physical Findings: AIMS: Facial and Oral Movements Muscles of Facial Expression: None, normal Lips and Perioral Area: None, normal Jaw: None, normal Tongue: None, normal,Extremity Movements Upper (arms, wrists, hands, fingers): None, normal Lower (legs, knees, ankles, toes): None, normal, Trunk Movements Neck, shoulders, hips: None, normal, Overall Severity Severity of abnormal movements (highest score from questions above): None, normal Incapacitation due to abnormal movements: None, normal Patient's awareness of abnormal movements (rate only patient's report): No Awareness, Dental Status Current problems with teeth and/or dentures?: No Does patient usually wear dentures?: No  CIWA:  CIWA-Ar Total: 4  COWS:  COWS Total Score: 5  Medication:  . chlordiazePOXIDE  25 mg Oral QID   Followed by  . chlordiazePOXIDE  25 mg Oral TID   Followed by  . chlordiazePOXIDE  25 mg Oral BH-qamhs   Followed by  . chlordiazePOXIDE  25 mg Oral Daily  . citalopram  20 mg Oral Daily  . cloNIDine  0.1 mg Oral QID   Followed by  . cloNIDine  0.1 mg Oral BH-qamhs   Followed by  . cloNIDine  0.1 mg Oral QAC breakfast  . gabapentin  200 mg Oral TID  . nicotine  21 mg Transdermal Q0600   Treatment Plan Summary: Daily contact with patient  to assess and evaluate symptoms and progress in treatment Medication management Plan: 1. Continue Clonidine taper. 2. Continue Librium taper 3. Continue Gabapentin as written. 4  Continue Citalopram as written. 5. Encouraged patient to ask for supportive meds for symptoms as needed 6. Patient is encouraged to attend groups as he is able.  Rona Ravens. Axten Pascucci PAC 05/27/2012, 12:38 PM

## 2012-05-28 MED ORDER — MAGNESIUM HYDROXIDE 400 MG/5ML PO SUSP: 30.0000 mL | Freq: Every day | ORAL | Status: DC | PRN

## 2012-05-28 MED ORDER — DOCUSATE SODIUM 100 MG PO CAPS
100.0000 mg | ORAL_CAPSULE | Freq: Two times a day (BID) | ORAL | Status: DC
  Administered 2012-05-28 – 2012-05-29 (×2): 100 mg via ORAL
  Filled 2012-05-28 (×5): qty 1
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
## 2012-05-28 NOTE — Discharge Planning (Signed)
[name] was seen with the treatment team.  Explained there are no beds at [facility] today.  Will call again tomorrow.  Will also call [facility] today to find out if he has been accepted there.  Plan is to discharge tomorrow, either to [facility] or to his mother's home with an admission date at [facility].

## 2012-05-28 NOTE — Progress Notes (Signed)
Patient did attend the evening karaoke group.  

## 2012-05-28 NOTE — Progress Notes (Signed)
Psychoeducational Group Note  Date:  05/28/2012 Time:  1100  Group Topic/Focus:  Building Self Esteem:   The Focus of this group is helping patients become aware of the effects of self-esteem on their lives, the things they and others do that enhance or undermine their self-esteem, seeing the relationship between their level of self-esteem and the choices they make and learning ways to enhance self-esteem.  Participation Level:  Active  Participation Quality:  Appropriate, Attentive and Sharing  Affect:  Appropriate  Cognitive:  Appropriate  Insight:  Good  Engagement in Group:  Good  Additional Comments: Pt participated in self esteem group. Pt defined self-esteem in own terms and difference between low and high self-esteem. Pt shared what were negative and positive physical, emotional, internal, and external things that increase and decrease self-esteem. Pt completed the self-esteem handout and discussed and shared answers during group.   Karleen Hampshire Brittini 05/28/2012, 1:27 PM

## 2012-05-28 NOTE — Progress Notes (Signed)
D:  Pt about on the unit today.  He presents with flat affect/depressed mood.  He denies SI/HI .  He rates his depression at a 1/10 and hopelessness as a 1/10.  He denies withdrawal symptoms.  He has been attending groups on the unit.  He stated that he slept well last night.  He does complain that he has not had a BM since last Monday--05/25/12. MD reportedly aware.  A:  Pt offered support and encouragement.  Given medications as ordered including MOM which he says so far has not been helpful.  He says he has been drinking increased fluids and walking a lot. RN encouraged him to continue.  R:  Remains on q 15 minute checks for safety.  Will continue to monitor.

## 2012-05-29 MED ORDER — GABAPENTIN 100 MG PO CAPS: 200.0000 mg | ORAL_CAPSULE | Freq: Three times a day (TID) | ORAL | Status: DC

## 2012-05-29 MED ORDER — DSS 100 MG PO CAPS: 100.0000 mg | ORAL_CAPSULE | Freq: Two times a day (BID) | ORAL | Status: AC

## 2012-05-29 MED ORDER — TRAZODONE HCL 100 MG PO TABS: 100.0000 mg | ORAL_TABLET | Freq: Every day | ORAL | Status: DC

## 2012-05-29 MED ORDER — TRAZODONE HCL 100 MG PO TABS
100.0000 mg | ORAL_TABLET | Freq: Every day | ORAL | Status: DC
  Filled 2012-05-29: qty 7

## 2012-05-29 MED ORDER — CITALOPRAM HYDROBROMIDE 20 MG PO TABS: 20.0000 mg | ORAL_TABLET | Freq: Every day | ORAL | Status: DC

## 2012-05-29 NOTE — Progress Notes (Signed)
RN Discharge Note: Patient able to verbalize discharge plan after reviewing AVS. Denies SI/HI or psychosis. Patient will leave with a supply of medication, AVS and bus pass. Release of information complete for [facility]. Affect bright and stated "I feel better". Hygiene good. No further questions or concerns.  Joice Lofts RN MS EdS 05/29/2012  1:35 PM

## 2012-05-29 NOTE — Progress Notes (Signed)
[facility] Group Notes:  (Counselor/Nursing/MHT/Case Management/Adjunct)   Type of Therapy:  Group Therapy 1:15 to 2:30 PM Friday 05/29/2012   Participation Level: Patient discharging,  Did Not Attend  Clide Dales 05/29/2012, 4:22 PM

## 2012-05-29 NOTE — Progress Notes (Signed)
[facility] Group Notes:  (Counselor/Nursing/MHT/Case Management/Adjunct)  05/29/2012 12:40 PM  Type of Therapy:  Psychoeducational Skills  Participation Level:  Active  Participation Quality:  Appropriate, Sharing and Supportive  Affect:  Appropriate  Cognitive:  Alert and Appropriate  Insight:  Good  Engagement in Group:  Good  Engagement in Therapy:  Good  Modes of Intervention:  Activity, Education, Problem-solving and Socialization  Summary of Progress/Problems:Pt attended and participated in Coping Skills Pictionary where group played pictionary and then discussed the coping skills at the end of group.     Dalia Heading 05/29/2012, 12:40 PM

## 2012-05-29 NOTE — Progress Notes (Signed)
[facility] Adult Inpatient Family/Significant Other Collateral Contact  Collateral Contact:  Devin Hodge, patient's mother, was contacted with written consent from the patient. Devin Hodge is extremely supportive of patient discharging to treatment but is comfortable with patient coming to her home until he is able to be admitted to treatment center. Devin Hodge advocated for patient to discharge with actual admit date verses being responsible himself for calling to check on openings.  Counselor agreed to advocate for same. Clinical research associate provided phone number for [facility].  Devin Hodge 05/29/2012 10:22 AM

## 2012-05-29 NOTE — Treatment Plan (Signed)
Interdisciplinary Treatment Plan Update (Adult)  Date: 05/29/2012  Time Reviewed:12:44 PM  Progress in Treatment:  Attending groups: Yes  Participating in groups: Yes  Taking medication as prescribed: Yes  Tolerating medication: Yes  Family/Significant other contact made: Yes Patient understands diagnosis: Yes  Discussing patient identified problems/goals with staff: Yes See below  Medical problems stabilized or resolved: Yes  Denies suicidal/homicidal ideation: Yes In tx team  Issues/concerns per patient self-inventory: none Other:  New problem(s) identified: N/A  Reason for Continuation of Hospitalization:  Discharge today Interventions implemented related to continuation of hospitalization: Additional comments:  Estimated length of stay: d/c today Discharge Plan: stay with mother until admission to [facility]-waiting for admission date. Attend AA/NA meetings New goal(s): N/A  Review of initial/current patient goals per problem list:  1. Goal(s):Eliminate SI  Met: Yes  Target date: 9/6 As evidenced FA:OZHY report in tx team 2. Goal (s): Stabilize mood  Met: Yes Target date:9/6 As evidenced by: Pt rated hopelessness and depression at a 1 3. Goal(s): Identify comprehensive sobriety plan  Met: Yes  Target date: 9/6 As evidenced by: Stay with mother until admission into [facility]; attend AA/NA meetings Attendees:    Patient: Devin Hodge 05/29/2012 12:44 PM    Family:     Physician: Lupe Carney  05/29/2012 12:44 PM    Nursing:  9/6/201312:44 PM   Case Manager: Richelle Ito, LCSW  05/29/2012 12:44 PM   Counselor: Ronda Fairly, LCSWA  9/6/201312:44 PM  .   Other: Trula Slade 05/29/2012 12:44PM   Other:     Other:     Other:     Scribe for Treatment Team: Trula Slade, MSW Intern, 9/6/201312:44 PM

## 2012-05-29 NOTE — Progress Notes (Signed)
Psychoeducational Group Note  Date:  05/29/2012 Time:  1100  Group Topic/Focus:  Relapse Prevention Planning:   The focus of this group is to define relapse and discuss the need for planning to combat relapse.  Participation Level:  Did Not Attend  Additional Comments:  Pt did not attend.  Dalia Heading 05/29/2012, 1:01 PM

## 2012-05-29 NOTE — Progress Notes (Signed)
Please see SRA completed yesterday for am discharge.  Pt seen and evaluated in treatment team prior to discharge this morning.  No significant change in MSE from yesterday afternoon.  Agree with SRA completed by this writer late yesterday in preparation for discharge this morning.  Pt cleared for discharge by team including nursing staff, case manager, counselor and physician.  Pt agreeable with plan.  See orders, SRA and AVS/discharge instructions.  No acute medical, psychiatric or safety issues noted at this time.  Chart reviewed.  Transitioning to [facility], feeling "great".

## 2012-05-29 NOTE — Progress Notes (Signed)
[facility] Case Management Discharge Plan:  Will you be returning to the same living situation after discharge: No. At discharge, do you have transportation home?:Yes,  mother Do you have the ability to pay for your medications:Yes,  mental health  Interagency Information:     Release of information consent forms completed and in the chart;  Patient's signature needed at discharge.  Patient to Follow up at:  Follow-up Information    Follow up with [facility]. (Rod will call you with an admission date.  Call Sarah at this number to confirm your bed.)    Contact information:   [address]  [place], [place]  [phone]         Patient denies SI/HI:   Yes,  yes    Safety Planning and Suicide Prevention discussed:  Yes,  yes  Barrier to discharge identified:No.  Summary and Recommendations:   Devin Hodge 05/29/2012, 10:17 AM

## 2012-06-01 LAB — HCV RNA QUANT
HCV Quantitative Log: 5.95 {Log} — ABNORMAL HIGH (ref <1.63)
HCV Quantitative: 884793 IU/mL — ABNORMAL HIGH (ref <43)

## 2012-06-01 NOTE — Progress Notes (Signed)
Patient Discharge Instructions:  After Visit Summary (AVS):   Faxed to:  06/01/2012 Psychiatric Admission Assessment Note:   Faxed to:  06/01/2012 Suicide Risk Assessment - Discharge Assessment:   Faxed to:  06/01/2012 Faxed/Sent to the Next Level Care provider:  06/01/2012  Faxed to [facility] @ [phone]  Wandra Scot, 06/01/2012, 1:11 PM

## 2012-06-04 ENCOUNTER — Other Ambulatory Visit (HOSPITAL_COMMUNITY): Payer: Self-pay | Admitting: Orthopaedic Surgery

## 2012-06-04 ENCOUNTER — Ambulatory Visit (HOSPITAL_COMMUNITY)
Admission: RE | Admit: 2012-06-04 | Discharge: 2012-06-04 | Disposition: A | Discharge status: Home / Self Care | Payer: Self-pay | Source: Ambulatory Visit | Attending: Orthopaedic Surgery | Admitting: Orthopaedic Surgery

## 2012-06-04 DIAGNOSIS — Z4789 Encounter for other orthopedic aftercare: Secondary | ICD-10-CM | POA: Insufficient documentation

## 2012-06-04 DIAGNOSIS — S82409A Unspecified fracture of shaft of unspecified fibula, initial encounter for closed fracture: Secondary | ICD-10-CM

## 2012-06-11 NOTE — Discharge Summary (Signed)
Physician Discharge Summary Note  Patient:  Devin Hodge is an 41 y.o., male MRN:  [id number] DOB:  1971-09-17 Patient phone:  [phone] (home)  Patient address:   [address] [place] [address]   Date of Admission:  05/25/2012 Date of Discharge: 05/29/12  Reason for Admission: see H&P.  Principal Problem:  *Opioid dependence Active Problems:  Alcohol dependence  Discharge Diagnoses: Alcohol Use Disorder; Opioid Use Disorder; Depressive Disorder Unspecified; CBP   Past Medical History   Diagnosis  Date   .  Kidney stone    .  Status post alcohol detoxification     Level of Care:  [facility]  Hospital Course:  Pt admitted for crisis stabilization, detox and treatment. Pt attended all therapeutic groups, was active in his treatment planning process and agreed the current medication regimen for further stability during recovery.  There were no acute issues during treatment and he was open to further substance abuse Tx.  All labs were reviewed in great detail and medical/psychiatric needs were addressed.  No acute safety issues were noted on the unit. Medications were reviewed with pt and medication education was provided. Mental health treatment, medication management and continued sobriety will mitigate against any potential increased risk of harm to self and/or others.  Discussed the importance of recovery with pt, as well as, tools to move forward in a healthy & safe manner using the 12 Step Process.    Consults: none.  Significant Diagnostic Studies:  See labs.  Discharge Vitals:   Blood pressure 112/78, pulse 88, temperature 98.1 F (36.7 C), temperature source Oral, resp. rate 18, height 5\' 10"  (1.778 m), weight 67.586 kg (149 lb), SpO2 99.00%.  Physical Findings: AIMS: Facial and Oral Movements Muscles of Facial Expression: None, normal Lips and Perioral Area: None, normal Jaw: None, normal Tongue: None, normal,Extremity Movements Upper (arms, wrists, hands, fingers): None,  normal Lower (legs, knees, ankles, toes): None, normal, Trunk Movements Neck, shoulders, hips: None, normal, Overall Severity Severity of abnormal movements (highest score from questions above): None, normal Incapacitation due to abnormal movements: None, normal Patient's awareness of abnormal movements (rate only patient's report): No Awareness, Dental Status Current problems with teeth and/or dentures?: No Does patient usually wear dentures?: No  CIWA:  CIWA-Ar Total: 0  COWS:  COWS Total Score: 0   Mental Status Exam: See Mental Status Examination and Suicide Risk Assessment completed by Attending Physician prior to discharge.  Discharge destination:  [facility]  Is patient on multiple antipsychotic therapies at discharge:  No   Has Patient had three or more failed trials of antipsychotic monotherapy by history:  No  Recommended Plan for Multiple Antipsychotic Therapies: NA.  Discharge Orders    Future Orders Please Complete By Expires   Diet - low sodium heart healthy          Medication List     As of 06/11/2012  3:40 PM    TAKE these medications      Indication    citalopram 20 MG tablet   Commonly known as: CELEXA   Take 1 tablet (20 mg total) by mouth daily. For depression/anxiety       gabapentin 100 MG capsule   Commonly known as: NEURONTIN   Take 2 capsules (200 mg total) by mouth 3 (three) times daily. For pain       ibuprofen 200 MG tablet   Commonly known as: ADVIL,MOTRIN   Take 800 mg by mouth as needed. For pain  traZODone 100 MG tablet   Commonly known as: DESYREL   Take 1 tablet (100 mg total) by mouth at bedtime. For sleep            Follow-up Information    Follow up with [facility]. (Rod will call you with an admission date.  Call Sarah at this number to confirm your bed.)    Contact information:   [address]  [place], [place]  [phone]         Plan Of Care/Follow-up recommendations: Date for [facility] admission pending. Added colace and Milk  of Mag for report of constipation. Pt requested d/c of clonidine and librium 2/2 sedation. Will observe overnight and d/c in am. Pt seen and evaluated in treatment team. Chart reviewed. Pt stable for and requesting discharge tomorrow am. Pt contracting for safety and does not currently meet [facility] involuntary commitment criteria for continued hospitalization against his will. Mental health treatment, medication management and continued sobriety will mitigate against the potential increased risk of harm to self and/or others. Discussed the importance of recovery further with pt, as well as, tools to move forward in a healthy & safe manner. Pt agreeable with the plan. Discussed with the team. Recommend follow up with AA/NA. Diet: Regular. Activity: As tolerated.   Signed: Lupe Carney 06/11/2012, 3:40 PM

## 2012-10-15 ENCOUNTER — Emergency Department (HOSPITAL_COMMUNITY)
Admission: EM | Admit: 2012-10-15 | Discharge: 2012-10-15 | Disposition: A | Discharge status: Home / Self Care | Payer: Self-pay | Attending: Emergency Medicine | Admitting: Emergency Medicine

## 2012-10-15 ENCOUNTER — Encounter (HOSPITAL_COMMUNITY): Payer: Self-pay | Admitting: Emergency Medicine

## 2012-10-15 DIAGNOSIS — Z87442 Personal history of urinary calculi: Secondary | ICD-10-CM | POA: Insufficient documentation

## 2012-10-15 DIAGNOSIS — E215 Disorder of parathyroid gland, unspecified: Secondary | ICD-10-CM | POA: Insufficient documentation

## 2012-10-15 DIAGNOSIS — R51 Headache: Secondary | ICD-10-CM | POA: Insufficient documentation

## 2012-10-15 DIAGNOSIS — R599 Enlarged lymph nodes, unspecified: Secondary | ICD-10-CM

## 2012-10-15 DIAGNOSIS — F17200 Nicotine dependence, unspecified, uncomplicated: Secondary | ICD-10-CM | POA: Insufficient documentation

## 2012-10-15 DIAGNOSIS — K0889 Other specified disorders of teeth and supporting structures: Secondary | ICD-10-CM

## 2012-10-15 DIAGNOSIS — Z79899 Other long term (current) drug therapy: Secondary | ICD-10-CM | POA: Insufficient documentation

## 2012-10-15 MED ORDER — HYDROCODONE-ACETAMINOPHEN 5-325 MG PO TABS: 1.0000 | ORAL_TABLET | ORAL | Status: DC | PRN

## 2012-10-15 MED ORDER — IBUPROFEN 800 MG PO TABS: 800.0000 mg | ORAL_TABLET | Freq: Three times a day (TID) | ORAL | Status: DC

## 2012-10-15 MED ORDER — OXYCODONE-ACETAMINOPHEN 5-325 MG PO TABS
1.0000 | ORAL_TABLET | Freq: Once | ORAL | Status: AC
  Administered 2012-10-15: 1 via ORAL
  Filled 2012-10-15: qty 1

## 2012-10-15 MED ORDER — PENICILLIN V POTASSIUM 500 MG PO TABS: 500.0000 mg | ORAL_TABLET | Freq: Three times a day (TID) | ORAL | Status: AC

## 2012-10-15 NOTE — ED Notes (Signed)
Pt c/o dental pain x 2 days.  

## 2012-10-15 NOTE — ED Provider Notes (Signed)
Medical screening examination/treatment/procedure(s) were performed by non-physician practitioner and as supervising physician I was immediately available for consultation/collaboration.   Dione Booze, MD 10/15/12 2251

## 2012-10-15 NOTE — ED Provider Notes (Signed)
History     CSN: [id number]  Arrival date & time 10/15/12  2211   First MD Initiated Contact with Patient 10/15/12 2228      Chief Complaint  Patient presents with  . Dental Pain    HPI Devin Hodge is a 42 y.o. male who presents to the ED with dental pain. The pain started 2 days ago.  He describes the pain as sharp and throbbing.  The pain is located in the right upper gum. he has had problems in the past and plans to have all his teeth removed.  The history was provided by the patient.  Past Medical History  Diagnosis Date  . Kidney stone   . Status post alcohol detoxification     Past Surgical History  Procedure Date  . Pleural scarification     No family history on file.  History  Substance Use Topics  . Smoking status: Current Every Day Smoker -- 0.5 packs/day    Types: Cigarettes  . Smokeless tobacco: Not on file  . Alcohol Use: No      Review of Systems  Constitutional: Negative for fever and chills.  HENT: Positive for dental problem. Negative for facial swelling.   Eyes: Negative for redness and visual disturbance.  Respiratory: Negative for cough and wheezing.   Cardiovascular: Negative for chest pain.  Gastrointestinal: Negative for nausea, vomiting and abdominal pain.  Genitourinary: Negative.   Musculoskeletal: Negative for back pain.  Neurological: Positive for headaches. Negative for light-headedness.  Psychiatric/Behavioral: Negative for confusion.    Allergies  Sulfa antibiotics  Home Medications   Current Outpatient Rx  Name  Route  Sig  Dispense  Refill  . CITALOPRAM HYDROBROMIDE 20 MG PO TABS   Oral   Take 1 tablet (20 mg total) by mouth daily. For depression/anxiety   30 tablet   0   . GABAPENTIN 100 MG PO CAPS   Oral   Take 2 capsules (200 mg total) by mouth 3 (three) times daily. For pain   90 capsule   0   . IBUPROFEN 200 MG PO TABS   Oral   Take 800 mg by mouth as needed. For pain         . TRAZODONE HCL 100 MG  PO TABS   Oral   Take 1 tablet (100 mg total) by mouth at bedtime. For sleep   30 tablet   0     BP 155/89  Pulse 81  Temp 97.6 F (36.4 C) (Oral)  Resp 18  Ht 6' (1.829 m)  Wt 190 lb (86.183 kg)  BMI 25.77 kg/m2  SpO2 100%  Physical Exam  Nursing note and vitals reviewed. Constitutional: He is oriented to person, place, and time. He appears well-developed and well-nourished.  HENT:  Head: Normocephalic and atraumatic.    Right Ear: Tympanic membrane and external ear normal.  Left Ear: Tympanic membrane and external ear normal.  Mouth/Throat: Uvula is midline, oropharynx is clear and moist and mucous membranes are normal. No oropharyngeal exudate.    Eyes: EOM are normal. Pupils are equal, round, and reactive to light.  Neck: Neck supple.       There is an enlarge node left.   Cardiovascular: Normal rate and regular rhythm.   Pulmonary/Chest: Effort normal. No respiratory distress. He has no wheezes.  Musculoskeletal: Normal range of motion. He exhibits no edema.  Lymphadenopathy:    He has cervical adenopathy.  Neurological: He is alert and oriented to person, place,  and time. No cranial nerve deficit.  Skin: Skin is warm and dry.  Psychiatric: He has a normal mood and affect. His behavior is normal. Judgment and thought content normal.   Procedures  Assessment: 42 y.o. male with dental pain   Enlarged lymph node  Plan:  Pain management   Antibiotic   Follow up with dentist Discussed with the patient and all questioned fully answered.   Medication List     As of 10/15/2012 10:42 PM    START taking these medications         HYDROcodone-acetaminophen 5-325 MG per tablet   Commonly known as: NORCO/VICODIN   Take 1 tablet by mouth every 4 (four) hours as needed for pain.      * ibuprofen 800 MG tablet   Commonly known as: ADVIL,MOTRIN   Take 1 tablet (800 mg total) by mouth 3 (three) times daily.      penicillin v potassium 500 MG tablet   Commonly known  as: VEETID   Take 1 tablet (500 mg total) by mouth 3 (three) times daily.     * Notice: This list has 1 medication(s) that are the same as other medications prescribed for you. Read the directions carefully, and ask your doctor or other care provider to review them with you.    ASK your doctor about these medications         citalopram 20 MG tablet   Commonly known as: CELEXA   Take 1 tablet (20 mg total) by mouth daily. For depression/anxiety      gabapentin 100 MG capsule   Commonly known as: NEURONTIN   Take 2 capsules (200 mg total) by mouth 3 (three) times daily. For pain      * ibuprofen 200 MG tablet   Commonly known as: ADVIL,MOTRIN      traZODone 100 MG tablet   Commonly known as: DESYREL   Take 1 tablet (100 mg total) by mouth at bedtime. For sleep     * Notice: This list has 1 medication(s) that are the same as other medications prescribed for you. Read the directions carefully, and ask your doctor or other care provider to review them with you.        Where to get your medications    These are the prescriptions that you need to pick up.   You may get these medications from any pharmacy.         HYDROcodone-acetaminophen 5-325 MG per tablet   ibuprofen 800 MG tablet   penicillin v potassium 500 MG tablet             Janne Napoleon, NP 10/15/12 2242

## 2012-11-11 ENCOUNTER — Emergency Department (HOSPITAL_COMMUNITY)
Admission: EM | Admit: 2012-11-11 | Discharge: 2012-11-12 | Disposition: A | Discharge status: Home / Self Care | Payer: Self-pay | Attending: Emergency Medicine | Admitting: Emergency Medicine

## 2012-11-11 ENCOUNTER — Encounter (HOSPITAL_COMMUNITY): Payer: Self-pay | Admitting: Emergency Medicine

## 2012-11-11 DIAGNOSIS — F1120 Opioid dependence, uncomplicated: Secondary | ICD-10-CM

## 2012-11-11 DIAGNOSIS — F3289 Other specified depressive episodes: Secondary | ICD-10-CM | POA: Insufficient documentation

## 2012-11-11 DIAGNOSIS — Z87442 Personal history of urinary calculi: Secondary | ICD-10-CM | POA: Insufficient documentation

## 2012-11-11 DIAGNOSIS — F1920 Other psychoactive substance dependence, uncomplicated: Secondary | ICD-10-CM | POA: Insufficient documentation

## 2012-11-11 DIAGNOSIS — F1410 Cocaine abuse, uncomplicated: Secondary | ICD-10-CM | POA: Insufficient documentation

## 2012-11-11 DIAGNOSIS — F1210 Cannabis abuse, uncomplicated: Secondary | ICD-10-CM | POA: Insufficient documentation

## 2012-11-11 DIAGNOSIS — F329 Major depressive disorder, single episode, unspecified: Secondary | ICD-10-CM

## 2012-11-11 DIAGNOSIS — F1010 Alcohol abuse, uncomplicated: Secondary | ICD-10-CM | POA: Insufficient documentation

## 2012-11-11 DIAGNOSIS — F17200 Nicotine dependence, unspecified, uncomplicated: Secondary | ICD-10-CM | POA: Insufficient documentation

## 2012-11-11 LAB — BASIC METABOLIC PANEL
Chloride: 99 mEq/L (ref 96–112)
Creatinine, Ser: 1.02 mg/dL (ref 0.50–1.35)
GFR calc Af Amer: >90 mL/min (ref >90)
Potassium: 3.9 mEq/L (ref 3.5–5.1)
Sodium: 134 mEq/L — ABNORMAL LOW (ref 135–145)

## 2012-11-11 LAB — URINALYSIS, ROUTINE W REFLEX MICROSCOPIC
Glucose, UA: NEGATIVE mg/dL
Ketones, ur: NEGATIVE mg/dL
Leukocytes, UA: NEGATIVE
pH: 6 (ref 5.0–8.0)

## 2012-11-11 LAB — CBC WITH DIFFERENTIAL/PLATELET
Basophils Absolute: 0 10*3/uL (ref 0.0–0.1)
Basophils Relative: 0 % (ref 0–1)
MCHC: 34.4 g/dL (ref 30.0–36.0)
Neutro Abs: 6 10*3/uL (ref 1.7–7.7)
Neutrophils Relative %: 68 % (ref 43–77)
RDW: 14.2 % (ref 11.5–15.5)

## 2012-11-11 LAB — URINE MICROSCOPIC-ADD ON

## 2012-11-11 LAB — RAPID URINE DRUG SCREEN, HOSP PERFORMED
Benzodiazepines: NOT DETECTED
Cocaine: POSITIVE — AB

## 2012-11-11 LAB — ETHANOL: Alcohol, Ethyl (B): <11 mg/dL (ref 0–11)

## 2012-11-11 MED ORDER — ACETAMINOPHEN 325 MG PO TABS: 650.0000 mg | ORAL_TABLET | ORAL | Status: DC | PRN

## 2012-11-11 MED ORDER — LORAZEPAM 2 MG/ML IJ SOLN: 1.0000 mg | Freq: Four times a day (QID) | INTRAMUSCULAR | Status: DC | PRN

## 2012-11-11 MED ORDER — ONDANSETRON HCL 4 MG PO TABS: 4.0000 mg | ORAL_TABLET | Freq: Three times a day (TID) | ORAL | Status: DC | PRN

## 2012-11-11 MED ORDER — ALUM & MAG HYDROXIDE-SIMETH 200-200-20 MG/5ML PO SUSP: 30.0000 mL | ORAL | Status: DC | PRN

## 2012-11-11 MED ORDER — NAPROXEN 250 MG PO TABS: 500.0000 mg | ORAL_TABLET | Freq: Two times a day (BID) | ORAL | Status: DC | PRN

## 2012-11-11 MED ORDER — LOPERAMIDE HCL 2 MG PO CAPS: 2.0000 mg | ORAL_CAPSULE | ORAL | Status: DC | PRN

## 2012-11-11 MED ORDER — FOLIC ACID 1 MG PO TABS
1.0000 mg | ORAL_TABLET | Freq: Every day | ORAL | Status: DC
  Administered 2012-11-12: 1 mg via ORAL
  Filled 2012-11-11: qty 1

## 2012-11-11 MED ORDER — HYDROXYZINE HCL 25 MG PO TABS: 25.0000 mg | ORAL_TABLET | Freq: Four times a day (QID) | ORAL | Status: DC | PRN

## 2012-11-11 MED ORDER — THIAMINE HCL 100 MG/ML IJ SOLN: 100.0000 mg | Freq: Every day | INTRAMUSCULAR | Status: DC

## 2012-11-11 MED ORDER — LORAZEPAM 1 MG PO TABS: 1.0000 mg | ORAL_TABLET | Freq: Four times a day (QID) | ORAL | Status: DC | PRN

## 2012-11-11 MED ORDER — NICOTINE 21 MG/24HR TD PT24
21.0000 mg | MEDICATED_PATCH | Freq: Every day | TRANSDERMAL | Status: DC
  Administered 2012-11-11 – 2012-11-12 (×2): 21 mg via TRANSDERMAL
  Filled 2012-11-11 (×2): qty 1

## 2012-11-11 MED ORDER — ADULT MULTIVITAMIN W/MINERALS CH
1.0000 | ORAL_TABLET | Freq: Every day | ORAL | Status: DC
  Administered 2012-11-12: 1 via ORAL
  Filled 2012-11-11: qty 1

## 2012-11-11 MED ORDER — IBUPROFEN 400 MG PO TABS: 600.0000 mg | ORAL_TABLET | Freq: Three times a day (TID) | ORAL | Status: DC | PRN

## 2012-11-11 MED ORDER — METHOCARBAMOL 500 MG PO TABS: 500.0000 mg | ORAL_TABLET | Freq: Three times a day (TID) | ORAL | Status: DC | PRN

## 2012-11-11 MED ORDER — ZOLPIDEM TARTRATE 5 MG PO TABS: 10.0000 mg | ORAL_TABLET | Freq: Every evening | ORAL | Status: DC | PRN

## 2012-11-11 MED ORDER — DICYCLOMINE HCL 20 MG PO TABS
20.0000 mg | ORAL_TABLET | Freq: Four times a day (QID) | ORAL | Status: DC | PRN
  Filled 2012-11-11: qty 1

## 2012-11-11 MED ORDER — VITAMIN B-1 100 MG PO TABS
100.0000 mg | ORAL_TABLET | Freq: Every day | ORAL | Status: DC
  Administered 2012-11-12: 100 mg via ORAL
  Filled 2012-11-11: qty 1
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
## 2012-11-11 NOTE — ED Notes (Signed)
Pt states want detox from opiates. Pt was at [facility] a few months ago for the same. Pt states last used today, pt took percocet.

## 2012-11-11 NOTE — ED Notes (Signed)
Patient states he is addicted to opiates; states had stopped and started taking them again about 2 months ago.  Patient denies SI/HI.  Patient requesting detox from opiates.

## 2012-11-11 NOTE — ED Notes (Signed)
Pt placed in paper scrubs, pt belongings labeled & placed in locker.

## 2012-11-12 MED ORDER — DICYCLOMINE HCL 10 MG PO CAPS
20.0000 mg | ORAL_CAPSULE | Freq: Four times a day (QID) | ORAL | Status: DC | PRN
  Administered 2012-11-12: 20 mg via ORAL
  Filled 2012-11-12: qty 2

## 2012-11-12 NOTE — BH Assessment (Signed)
Assessment Note   Devin Hodge is an 42 y.o. male.  Voluntary admission to ED requesting help with opiate and alcohol addiction. Affect blunted, mood depressed. Somewhat guarded when answering questions. Admits to drinking four 24-oz beers daily and "snorts" approximately 120 mg oxycodone a day. He was admitted to [facility] about 6 months ago for detox and stayed clean for about 3 months until relationship problems with his girlfriend triggered him to relapse. His mother is supportive and he believes that he can live with her after he is detoxed.  His preference is to go to a longer-term rehab facility like [facility] if possible. Plan is to place pt in an appropriate inpatient setting for help with alcohol and opiate addiction.   Axis I: Substance Abuse Axis II: Deferred Axis III:  Past Medical History  Diagnosis Date  . Kidney stone   . Status post alcohol detoxification    Axis IV: economic problems, housing problems, occupational problems, other psychosocial or environmental problems, problems related to social environment, problems with access to health care services and problems with primary support group Axis V: 51-60 moderate symptoms  Past Medical History:  Past Medical History  Diagnosis Date  . Kidney stone   . Status post alcohol detoxification     Past Surgical History  Procedure Laterality Date  . Pleural scarification      Family History: No family history on file.  Social History:  reports that he has been smoking Cigarettes.  He has been smoking about 0.50 packs per day. He does not have any smokeless tobacco history on file. He reports that  drinks alcohol. He reports that he does not use illicit drugs.  Additional Social History:     CIWA: CIWA-Ar BP: 110/67 mmHg Pulse Rate: 69 COWS:    Allergies:  Allergies  Allergen Reactions  . Sulfa Antibiotics Hives    Home Medications:  (Not in a hospital admission)  OB/GYN Status:  No LMP for male patient.  General  Assessment Data Location of Assessment: [facility] ACT Assessment: Yes Living Arrangements: Alone Can pt return to current living arrangement?: Yes Admission Status: Voluntary Is patient capable of signing voluntary admission?: Yes Transfer from: Home Referral Source: Self/Family/Friend  Education Status Is patient currently in school?: No  Risk to self Suicidal Ideation: No Suicidal Intent: No Is patient at risk for suicide?: No Suicidal Plan?: No Access to Means: No Previous Attempts/Gestures: No Intentional Self Injurious Behavior: None Family Suicide History: No Recent stressful life event(s): Other (Comment) (addiction to opiates) Persecutory voices/beliefs?: No Depression: Yes Depression Symptoms: Despondent;Isolating;Feeling worthless/self pity;Guilt Substance abuse history and/or treatment for substance abuse?: Yes Suicide prevention information given to non-admitted patients: Not applicable  Risk to Others Homicidal Ideation: No Thoughts of Harm to Others: No Current Homicidal Intent: No Current Homicidal Plan: No Access to Homicidal Means: No History of harm to others?: No Assessment of Violence: None Noted  Psychosis Hallucinations: None noted Delusions: None noted  Mental Status Report Appear/Hygiene: Disheveled Eye Contact: Good Motor Activity: Freedom of movement Speech: Logical/coherent Level of Consciousness: Alert Mood: Depressed Affect: Blunted Anxiety Level: Minimal Thought Processes: Coherent;Relevant Judgement: Impaired Orientation: Person;Place;Time;Situation Obsessive Compulsive Thoughts/Behaviors: Moderate  Cognitive Functioning Concentration: Decreased Memory: Recent Intact;Remote Intact IQ: Average Insight: Fair Impulse Control: Poor Appetite: Fair Sleep: Increased Total Hours of Sleep: 14 Vegetative Symptoms: Staying in bed  ADLScreening [facility] Assessment Services) Patient's cognitive ability adequate to safely complete daily  activities?: Yes Patient able to express need for assistance with ADLs?:  Yes Independently performs ADLs?: Yes (appropriate for developmental age)  Abuse/Neglect [facility]) Physical Abuse: Denies Verbal Abuse: Denies Sexual Abuse: Denies  Prior Inpatient Therapy Prior Inpatient Therapy: Yes Prior Therapy Dates: 1998 Prior Therapy Facilty/Provider(s): [facility] Reason for Treatment: substance dependence     ADL Screening (condition at time of admission) Patient's cognitive ability adequate to safely complete daily activities?: Yes Patient able to express need for assistance with ADLs?: Yes Independently performs ADLs?: Yes (appropriate for developmental age)       Abuse/Neglect Assessment (Assessment to be complete while patient is alone) Physical Abuse: Denies Verbal Abuse: Denies Sexual Abuse: Denies Values / Beliefs Cultural Requests During Hospitalization: None Spiritual Requests During Hospitalization: None        Additional Information 1:1 In Past 12 Months?: No CIRT Risk: No Elopement Risk: No Does patient have medical clearance?: No     Disposition:  Disposition Disposition of Patient: Inpatient treatment program Type of inpatient treatment program: Adult  On Site Evaluation by:   Reviewed with Physician:     Genia Del 11/12/2012 9:30 AM

## 2012-11-12 NOTE — ED Notes (Signed)
Spoke with [facility] and Delight Stare [phone]. Notified of pt d/c and arrival approximately 1315-1330.

## 2013-02-01 ENCOUNTER — Encounter (HOSPITAL_COMMUNITY): Payer: Self-pay

## 2013-02-01 ENCOUNTER — Emergency Department (HOSPITAL_COMMUNITY)
Admission: EM | Admit: 2013-02-01 | Discharge: 2013-02-01 | Disposition: A | Discharge status: Home / Self Care | Payer: Self-pay | Attending: Emergency Medicine | Admitting: Emergency Medicine

## 2013-02-01 DIAGNOSIS — R059 Cough, unspecified: Secondary | ICD-10-CM | POA: Insufficient documentation

## 2013-02-01 DIAGNOSIS — K029 Dental caries, unspecified: Secondary | ICD-10-CM | POA: Insufficient documentation

## 2013-02-01 DIAGNOSIS — Z79899 Other long term (current) drug therapy: Secondary | ICD-10-CM | POA: Insufficient documentation

## 2013-02-01 DIAGNOSIS — K089 Disorder of teeth and supporting structures, unspecified: Secondary | ICD-10-CM | POA: Insufficient documentation

## 2013-02-01 DIAGNOSIS — Z87442 Personal history of urinary calculi: Secondary | ICD-10-CM | POA: Insufficient documentation

## 2013-02-01 DIAGNOSIS — R05 Cough: Secondary | ICD-10-CM | POA: Insufficient documentation

## 2013-02-01 DIAGNOSIS — F17200 Nicotine dependence, unspecified, uncomplicated: Secondary | ICD-10-CM | POA: Insufficient documentation

## 2013-02-01 DIAGNOSIS — K044 Acute apical periodontitis of pulpal origin: Secondary | ICD-10-CM | POA: Insufficient documentation

## 2013-02-01 DIAGNOSIS — K0889 Other specified disorders of teeth and supporting structures: Secondary | ICD-10-CM

## 2013-02-01 DIAGNOSIS — J209 Acute bronchitis, unspecified: Secondary | ICD-10-CM | POA: Insufficient documentation

## 2013-02-01 DIAGNOSIS — J40 Bronchitis, not specified as acute or chronic: Secondary | ICD-10-CM

## 2013-02-01 DIAGNOSIS — J3489 Other specified disorders of nose and nasal sinuses: Secondary | ICD-10-CM | POA: Insufficient documentation

## 2013-02-01 MED ORDER — IBUPROFEN 800 MG PO TABS
800.0000 mg | ORAL_TABLET | Freq: Once | ORAL | Status: AC
  Administered 2013-02-01: 800 mg via ORAL
  Filled 2013-02-01: qty 1

## 2013-02-01 MED ORDER — CLINDAMYCIN HCL 150 MG PO CAPS
300.0000 mg | ORAL_CAPSULE | Freq: Once | ORAL | Status: AC
  Administered 2013-02-01: 300 mg via ORAL
  Filled 2013-02-01: qty 2

## 2013-02-01 MED ORDER — HYDROCODONE-HOMATROPINE 5-1.5 MG/5ML PO SYRP: 5.0000 mL | ORAL_SOLUTION | Freq: Four times a day (QID) | ORAL | Status: DC | PRN

## 2013-02-01 MED ORDER — AMOXICILLIN-POT CLAVULANATE 875-125 MG PO TABS: 1.0000 | ORAL_TABLET | Freq: Two times a day (BID) | ORAL | Status: DC

## 2013-02-01 MED ORDER — HYDROCOD POLST-CHLORPHEN POLST 10-8 MG/5ML PO LQCR
5.0000 mL | Freq: Once | ORAL | Status: AC
  Administered 2013-02-01: 5 mL via ORAL
  Filled 2013-02-01: qty 5

## 2013-02-01 MED ORDER — OXYCODONE-ACETAMINOPHEN 5-325 MG PO TABS: 2.0000 | ORAL_TABLET | ORAL | Status: DC | PRN

## 2013-02-01 NOTE — ED Notes (Signed)
Patient requesting D/c. MD aware.

## 2013-02-01 NOTE — ED Provider Notes (Signed)
History     This chart was scribed for Devin Hutching, MD, MD by [name], ED Scribe. The patient was seen in room APA12/APA12 and the patient's care was started at 7:57 AM   CSN: [id number]  Arrival date & time 02/01/13  0700   None     Chief Complaint  Patient presents with  . Dental Pain    (Consider location/radiation/quality/duration/timing/severity/associated sxs/prior treatment) The history is provided by the patient and medical records. No language interpreter was used.   HPI Comments: Devin Hodge is a 42 y.o. male who presents to the Emergency Department complaining of constant, moderate right upper lateral inciser dental pain that was onset this morning. Pt states that he does not have a primary dentist but want to get the tooth pulled out. Pt states he has productive cough with yellow, green sputum, and congestion. Pt denies fever, chills, nausea, vomiting, diarrhea, weakness, SOB and any other pain. Pt denies any other health issues. Pt is a current occasional smoker. Pt has past surgical history of pleural scarification.    Past Medical History  Diagnosis Date  . Kidney stone   . Status post alcohol detoxification     Past Surgical History  Procedure Laterality Date  . Pleural scarification      No family history on file.  History  Substance Use Topics  . Smoking status: Current Every Day Smoker -- 0.50 packs/day    Types: Cigarettes  . Smokeless tobacco: Not on file  . Alcohol Use: No     Comment: former      Review of Systems A complete 10 system review of systems was obtained and all systems are negative except as noted in the HPI and PMH.    Allergies  Sulfa antibiotics  Home Medications   Current Outpatient Rx  Name  Route  Sig  Dispense  Refill  . citalopram (CELEXA) 20 MG tablet   Oral   Take 1 tablet (20 mg total) by mouth daily. For depression/anxiety   30 tablet   0   . ibuprofen (ADVIL,MOTRIN) 200 MG tablet   Oral   Take 800  mg by mouth as needed. For pain         . EXPIRED: traZODone (DESYREL) 100 MG tablet   Oral   Take 1 tablet (100 mg total) by mouth at bedtime. For sleep   30 tablet   0     BP 126/74  Pulse 76  Temp(Src) 98.9 F (37.2 C) (Oral)  Resp 18  Ht 6' (1.829 m)  Wt 185 lb (83.915 kg)  BMI 25.08 kg/m2  SpO2 98%  Physical Exam  Nursing note and vitals reviewed. Constitutional: He is oriented to person, place, and time. He appears well-developed and well-nourished.  HENT:  Head: Normocephalic and atraumatic.  Right upper lateral inciser grossly infected with caries    Eyes: Conjunctivae and EOM are normal. Pupils are equal, round, and reactive to light.  Neck: Normal range of motion. Neck supple.  Cardiovascular: Normal rate, regular rhythm and normal heart sounds.   Pulmonary/Chest: Effort normal and breath sounds normal.  Abdominal: Soft. Bowel sounds are normal.  Musculoskeletal: Normal range of motion.  Neurological: He is alert and oriented to person, place, and time.  Skin: Skin is warm and dry.  Psychiatric: He has a normal mood and affect.    ED Course  Procedures (including critical care time) DIAGNOSTIC STUDIES: Oxygen Saturation is 98% on room air, normal by my interpretation.  COORDINATION OF CARE:  8:05 AM Discussed ED treatment with pt and pt agrees to referral a dentist, given pain medicine cough syrup and advised to take cough syrup at night and antibiotic. Pt is instructed not to take cough medication with pain medication. Pt is aware that tooth is severe and needs to be pulled.  Pt was diagnose with URI.  Labs Reviewed - No data to display No results found.   No diagnosis found.    MDM  We'll treat for a tooth infection with Augmentin.  Rx cough syrup and pain medication. Referral to Dentist   I personally performed the services described in this documentation, which was scribed in my presence. The recorded information has been reviewed and is  accurate.       Devin Hutching, MD 02/01/13 [phone]

## 2013-02-01 NOTE — ED Notes (Signed)
Pt woke this am with right side of mouth swollen.

## 2013-02-23 ENCOUNTER — Encounter (HOSPITAL_COMMUNITY): Payer: Self-pay

## 2013-02-23 ENCOUNTER — Emergency Department (HOSPITAL_COMMUNITY)
Admission: EM | Admit: 2013-02-23 | Discharge: 2013-02-23 | Discharge status: Against Medical Advice | Payer: Self-pay | Attending: Emergency Medicine | Admitting: Emergency Medicine

## 2013-02-23 DIAGNOSIS — Z87442 Personal history of urinary calculi: Secondary | ICD-10-CM | POA: Insufficient documentation

## 2013-02-23 DIAGNOSIS — F1510 Other stimulant abuse, uncomplicated: Secondary | ICD-10-CM | POA: Insufficient documentation

## 2013-02-23 DIAGNOSIS — R1013 Epigastric pain: Secondary | ICD-10-CM | POA: Insufficient documentation

## 2013-02-23 DIAGNOSIS — F1410 Cocaine abuse, uncomplicated: Secondary | ICD-10-CM | POA: Diagnosis not present

## 2013-02-23 DIAGNOSIS — R110 Nausea: Secondary | ICD-10-CM | POA: Insufficient documentation

## 2013-02-23 DIAGNOSIS — F17200 Nicotine dependence, unspecified, uncomplicated: Secondary | ICD-10-CM | POA: Insufficient documentation

## 2013-02-23 DIAGNOSIS — K219 Gastro-esophageal reflux disease without esophagitis: Secondary | ICD-10-CM | POA: Insufficient documentation

## 2013-02-23 DIAGNOSIS — F1110 Opioid abuse, uncomplicated: Secondary | ICD-10-CM | POA: Diagnosis not present

## 2013-02-23 LAB — URINE MICROSCOPIC-ADD ON

## 2013-02-23 LAB — CBC WITH DIFFERENTIAL/PLATELET
Basophils Absolute: 0.1 10*3/uL (ref 0.0–0.1)
Basophils Relative: 1 % (ref 0–1)
Eosinophils Absolute: 0.1 10*3/uL (ref 0.0–0.7)
Eosinophils Relative: 1 % (ref 0–5)
HCT: 39.4 % (ref 39.0–52.0)
MCHC: 34.5 g/dL (ref 30.0–36.0)
MCV: 88.1 fL (ref 78.0–100.0)
Monocytes Absolute: 0.5 10*3/uL (ref 0.1–1.0)
Platelets: 169 10*3/uL (ref 150–400)
RDW: 14 % (ref 11.5–15.5)

## 2013-02-23 LAB — COMPREHENSIVE METABOLIC PANEL
AST: 29 U/L (ref 0–37)
Albumin: 3.8 g/dL (ref 3.5–5.2)
Calcium: 9.4 mg/dL (ref 8.4–10.5)
Creatinine, Ser: 1.12 mg/dL (ref 0.50–1.35)
GFR calc non Af Amer: 78 mL/min — ABNORMAL LOW (ref >90)
Sodium: 139 mEq/L (ref 135–145)
Total Protein: 7.2 g/dL (ref 6.0–8.3)

## 2013-02-23 LAB — URINALYSIS, ROUTINE W REFLEX MICROSCOPIC
Glucose, UA: NEGATIVE mg/dL
Leukocytes, UA: NEGATIVE
Nitrite: NEGATIVE
Specific Gravity, Urine: >1.03 — ABNORMAL HIGH (ref 1.005–1.030)
pH: 5.5 (ref 5.0–8.0)

## 2013-02-23 LAB — RAPID URINE DRUG SCREEN, HOSP PERFORMED
Amphetamines: POSITIVE — AB
Barbiturates: NOT DETECTED
Benzodiazepines: NOT DETECTED
Cocaine: POSITIVE — AB

## 2013-02-23 MED ORDER — KETOROLAC TROMETHAMINE 30 MG/ML IJ SOLN
30.0000 mg | Freq: Once | INTRAMUSCULAR | Status: AC
  Administered 2013-02-23: 30 mg via INTRAVENOUS
  Filled 2013-02-23: qty 1

## 2013-02-23 MED ORDER — SODIUM CHLORIDE 0.9 % IV SOLN: 1000.0000 mL | INTRAVENOUS | Status: DC

## 2013-02-23 MED ORDER — SODIUM CHLORIDE 0.9 % IV SOLN
1000.0000 mL | Freq: Once | INTRAVENOUS | Status: AC
  Administered 2013-02-23: 1000 mL via INTRAVENOUS

## 2013-02-23 MED ORDER — DIPHENHYDRAMINE HCL 50 MG/ML IJ SOLN
25.0000 mg | Freq: Once | INTRAMUSCULAR | Status: AC
  Administered 2013-02-23: 25 mg via INTRAVENOUS
  Filled 2013-02-23: qty 1

## 2013-02-23 MED ORDER — GI COCKTAIL ~~LOC~~
30.0000 mL | Freq: Once | ORAL | Status: AC
  Administered 2013-02-23: 30 mL via ORAL
  Filled 2013-02-23: qty 30

## 2013-02-23 MED ORDER — FAMOTIDINE 20 MG PO TABS
20.0000 mg | ORAL_TABLET | Freq: Once | ORAL | Status: AC
  Administered 2013-02-23: 20 mg via ORAL
  Filled 2013-02-23: qty 1

## 2013-02-23 MED ORDER — PROMETHAZINE HCL 25 MG/ML IJ SOLN
25.0000 mg | Freq: Once | INTRAMUSCULAR | Status: AC
  Administered 2013-02-23: 25 mg via INTRAMUSCULAR
  Filled 2013-02-23: qty 1

## 2013-02-23 MED ORDER — METOCLOPRAMIDE HCL 5 MG/ML IJ SOLN
10.0000 mg | Freq: Once | INTRAMUSCULAR | Status: AC
  Administered 2013-02-23: 10 mg via INTRAVENOUS
  Filled 2013-02-23: qty 2

## 2013-02-23 NOTE — ED Notes (Signed)
Pt c/o upper abd pain for the past 2 days.  Reports nausea, no vomiting or diarrhea.  LBM was today.

## 2013-02-23 NOTE — ED Notes (Signed)
Pt sleeping chest rise and fall

## 2013-02-23 NOTE — ED Notes (Signed)
C/O epigastric pain 9/10 at worst, 5/10 at best.  Comes in waves.  No c/o dyspepsia or vomiting. No hx ulcers or reflux.

## 2013-02-23 NOTE — ED Provider Notes (Signed)
History     This chart was scribed for [name], MD, MD by [name], ED Scribe. The patient was seen in room APA18/APA18 and the patient's care was started at 4:05 PM.   CSN: [id number]  Arrival date & time 02/23/13  1543      Chief Complaint  Patient presents with  . Abdominal Pain     Patient is a 42 y.o. male presenting with abdominal pain. The history is provided by the patient and medical records. No language interpreter was used.  Abdominal Pain Associated symptoms include abdominal pain.   HPI Comments: Devin Hodge is a 42 y.o. male who presents to the Emergency Department complaining of intermittent, moderate sharp, epigastric abdominal pain onset a few days ago. The episodes of abdominal pain last for 15 seconds to a couple of minutes. Pt denies hx of similar pain. He mentions that he has nausea. He reports his LBM was today. He mentions his urine is dark yellow  in color. He states that pain is aggravated by breathing deeply, laying flat and when he lifts objects. Pt has taken Advil without relief of pain. He denies eating food aggravating the pain. He states he has acid reflux. Pt denies radiation of pain, fever, chills, vomiting, constipation, diarrhea, weakness, cough, SOB,urinary symptoms and any other pain. He has had a normal appetite.   Pt does not have PCP.    Pt smokes a pack of cigarettes/day.  He states he last had alcohol a couple months ago. He states that he has not used any prescription pain medication except for what was prescribed for dental pain.     Past Medical History  Diagnosis Date  . Kidney stone   . Status post alcohol detoxification   narcotic addiction (cocaine, prescription narcotics)  Past Surgical History  Procedure Laterality Date  . Pleural scarification      No family history on file.  History  Substance Use Topics  . Smoking status: Current Every Day Smoker -- 0.50 packs/day    Types: Cigarettes  . Smokeless  tobacco: Not on file  . Alcohol Use: No     Comment: former- last drank etoh 2 months ago.   employed Staying with his mother the past 4 days    Review of Systems  Constitutional: Negative for fever and chills.  Gastrointestinal: Positive for nausea and abdominal pain. Negative for vomiting, diarrhea and constipation.  Genitourinary: Negative for dysuria.  All other systems reviewed and are negative.    Allergies  Sulfa antibiotics  Home Medications   Current Outpatient Rx  Name  Route  Sig  Dispense  Refill  . ibuprofen (ADVIL,MOTRIN) 200 MG tablet   Oral   Take 800 mg by mouth daily as needed. For pain           BP 125/78  Pulse 72  Temp(Src) 98.2 F (36.8 C) (Oral)  Resp 18  Ht 6' (1.829 m)  Wt 175 lb (79.379 kg)  BMI 23.73 kg/m2  SpO2 99%  Physical Exam  Nursing note and vitals reviewed. Constitutional: He is oriented to person, place, and time. He appears well-developed and well-nourished.  Non-toxic appearance. He does not appear ill. No distress.  HENT:  Head: Normocephalic and atraumatic.  Right Ear: External ear normal.  Left Ear: External ear normal.  Nose: Nose normal. No mucosal edema or rhinorrhea.  Mouth/Throat: Oropharynx is clear and moist and mucous membranes are normal. No dental abscesses or edematous.  Eyes: Conjunctivae and  EOM are normal. Pupils are equal, round, and reactive to light.  Neck: Normal range of motion and full passive range of motion without pain. Neck supple.  Cardiovascular: Normal rate, regular rhythm and normal heart sounds.  Exam reveals no gallop and no friction rub.   No murmur heard. Pulmonary/Chest: Effort normal and breath sounds normal. No respiratory distress. He has no wheezes. He has no rhonchi. He has no rales. He exhibits no tenderness and no crepitus.  Abdominal: Soft. Normal appearance and bowel sounds are normal. He exhibits no distension. There is tenderness (diffusely in upper abdomen but worse in  epigastric  ). There is no rebound and no guarding.  Musculoskeletal: Normal range of motion. He exhibits no edema and no tenderness.  Moves all extremities well.   Neurological: He is alert and oriented to person, place, and time. He has normal strength. No cranial nerve deficit.  Skin: Skin is warm, dry and intact. No rash noted. No erythema. No pallor.  Psychiatric: He has a normal mood and affect. His speech is normal and behavior is normal. His mood appears not anxious.    ED Course  Procedures (including critical care time)  Medications  0.9 %  sodium chloride infusion (0 mLs Intravenous Stopped 02/23/13 1919)    Followed by  0.9 %  sodium chloride infusion (1,000 mLs Intravenous Not Given 02/23/13 1951)  gi cocktail (Maalox,Lidocaine,Donnatal) (30 mLs Oral Given 02/23/13 1626)  famotidine (PEPCID) tablet 20 mg (20 mg Oral Given 02/23/13 1626)  promethazine (PHENERGAN) injection 25 mg (25 mg Intramuscular Given 02/23/13 1626)  metoCLOPramide (REGLAN) injection 10 mg (10 mg Intravenous Given 02/23/13 1746)  diphenhydrAMINE (BENADRYL) injection 25 mg (25 mg Intravenous Given 02/23/13 1746)  ketorolac (TORADOL) 30 MG/ML injection 30 mg (30 mg Intravenous Given 02/23/13 1746)    DIAGNOSTIC STUDIES: Oxygen Saturation is 99% on room air, normal by my interpretation.    COORDINATION OF CARE: 4:13 PM Discussed ED treatment with pt and pt agrees.    5:39 PM Recheck. Pt still has pain after medication. Will order more pain medication. Still awaiting lab results.   7:44 PM Recheck: Pt states he has slept but he still has pain when he woke up. We discussed putting him on PPI for his abdominal pain. I have discussed his UDS, he states he hasn't had any narcotics in about 2 weeks. When told the results of his UDS, patient left the ED.   Results for orders placed during the hospital encounter of 02/23/13  URINALYSIS, ROUTINE W REFLEX MICROSCOPIC      Result Value Range   Color, Urine YELLOW  YELLOW    APPearance CLEAR  CLEAR   Specific Gravity, Urine >1.030 (*) 1.005 - 1.030   pH 5.5  5.0 - 8.0   Glucose, UA NEGATIVE  NEGATIVE mg/dL   Hgb urine dipstick MODERATE (*) NEGATIVE   Bilirubin Urine SMALL (*) NEGATIVE   Ketones, ur TRACE (*) NEGATIVE mg/dL   Protein, ur 30 (*) NEGATIVE mg/dL   Urobilinogen, UA 0.2  0.0 - 1.0 mg/dL   Nitrite NEGATIVE  NEGATIVE   Leukocytes, UA NEGATIVE  NEGATIVE  URINE RAPID DRUG SCREEN (HOSP PERFORMED)      Result Value Range   Opiates POSITIVE (*) NONE DETECTED   Cocaine POSITIVE (*) NONE DETECTED   Benzodiazepines NONE DETECTED  NONE DETECTED   Amphetamines POSITIVE (*) NONE DETECTED   Tetrahydrocannabinol POSITIVE (*) NONE DETECTED   Barbiturates NONE DETECTED  NONE DETECTED  CBC WITH DIFFERENTIAL  Result Value Range   WBC 6.7  4.0 - 10.5 K/uL   RBC 4.47  4.22 - 5.81 MIL/uL   Hemoglobin 13.6  13.0 - 17.0 g/dL   HCT 16.1  09.6 - 04.5 %   MCV 88.1  78.0 - 100.0 fL   MCH 30.4  26.0 - 34.0 pg   MCHC 34.5  30.0 - 36.0 g/dL   RDW 40.9  81.1 - 91.4 %   Platelets 169  150 - 400 K/uL   Neutrophils Relative % 56  43 - 77 %   Neutro Abs 3.8  1.7 - 7.7 K/uL   Lymphocytes Relative 35  12 - 46 %   Lymphs Abs 2.4  0.7 - 4.0 K/uL   Monocytes Relative 7  3 - 12 %   Monocytes Absolute 0.5  0.1 - 1.0 K/uL   Eosinophils Relative 1  0 - 5 %   Eosinophils Absolute 0.1  0.0 - 0.7 K/uL   Basophils Relative 1  0 - 1 %   Basophils Absolute 0.1  0.0 - 0.1 K/uL  COMPREHENSIVE METABOLIC PANEL      Result Value Range   Sodium 139  135 - 145 mEq/L   Potassium 3.5  3.5 - 5.1 mEq/L   Chloride 104  96 - 112 mEq/L   CO2 25  19 - 32 mEq/L   Glucose, Bld 84  70 - 99 mg/dL   BUN 19  6 - 23 mg/dL   Creatinine, Ser 7.82  0.50 - 1.35 mg/dL   Calcium 9.4  8.4 - 95.6 mg/dL   Total Protein 7.2  6.0 - 8.3 g/dL   Albumin 3.8  3.5 - 5.2 g/dL   AST 29  0 - 37 U/L   ALT 38  0 - 53 U/L   Alkaline Phosphatase 70  39 - 117 U/L   Total Bilirubin 0.5  0.3 - 1.2 mg/dL   GFR calc  non Af Amer 78 (*) >90 mL/min   GFR calc Af Amer >90  >90 mL/min  ETHANOL      Result Value Range   Alcohol, Ethyl (B) <11  0 - 11 mg/dL  URINE MICROSCOPIC-ADD ON      Result Value Range   Squamous Epithelial / LPF RARE  RARE   WBC, UA 0-2  <3 WBC/hpf   RBC / HPF 7-10  <3 RBC/hpf   Bacteria, UA MANY (*) RARE   Crystals CA OXALATE CRYSTALS (*) NEGATIVE  LIPASE, BLOOD      Result Value Range   Lipase 21  11 - 59 U/L   Laboratory interpretation all normal except + opiates, cocaine, THC, amphetamines       Date: 02/23/2013  Rate: 66  Rhythm: normal sinus rhythm  QRS Axis: normal  Intervals: normal  ST/T Wave abnormalities: normal  Conduction Disutrbances:IRBBB  Narrative Interpretation:   Old EKG Reviewed: none available    1. Narcotic abuse   2. Cocaine abuse   3. Amphetamine abuse   4. Epigastric abdominal pain     Pt left AMA   Devoria Albe, MD, FACEP   MDM    I personally performed the services described in this documentation, which was scribed in my presence. The recorded information has been reviewed and considered.  Devoria Albe, MD, Armando Gang    [name], MD 02/23/13 2000

## 2013-03-08 ENCOUNTER — Emergency Department (HOSPITAL_COMMUNITY)
Admission: EM | Admit: 2013-03-08 | Discharge: 2013-03-08 | Disposition: A | Discharge status: Home / Self Care | Payer: Self-pay | Attending: Emergency Medicine | Admitting: Emergency Medicine

## 2013-03-08 ENCOUNTER — Encounter (HOSPITAL_COMMUNITY): Payer: Self-pay | Admitting: Emergency Medicine

## 2013-03-08 DIAGNOSIS — Z87442 Personal history of urinary calculi: Secondary | ICD-10-CM | POA: Insufficient documentation

## 2013-03-08 DIAGNOSIS — K089 Disorder of teeth and supporting structures, unspecified: Secondary | ICD-10-CM | POA: Insufficient documentation

## 2013-03-08 DIAGNOSIS — R109 Unspecified abdominal pain: Secondary | ICD-10-CM | POA: Insufficient documentation

## 2013-03-08 DIAGNOSIS — K0889 Other specified disorders of teeth and supporting structures: Secondary | ICD-10-CM

## 2013-03-08 DIAGNOSIS — F17200 Nicotine dependence, unspecified, uncomplicated: Secondary | ICD-10-CM | POA: Insufficient documentation

## 2013-03-08 MED ORDER — TRAMADOL HCL 50 MG PO TABS: 50.0000 mg | ORAL_TABLET | Freq: Four times a day (QID) | ORAL | Status: DC | PRN

## 2013-03-08 MED ORDER — TRAMADOL HCL 50 MG PO TABS
50.0000 mg | ORAL_TABLET | Freq: Once | ORAL | Status: AC
  Administered 2013-03-08: 50 mg via ORAL
  Filled 2013-03-08: qty 1

## 2013-03-08 MED ORDER — MELOXICAM 7.5 MG PO TABS: ORAL_TABLET | ORAL | Status: DC

## 2013-03-08 MED ORDER — CEFTRIAXONE SODIUM 1 G IJ SOLR
1.0000 g | Freq: Once | INTRAMUSCULAR | Status: AC
  Administered 2013-03-08: 1 g via INTRAMUSCULAR
  Filled 2013-03-08: qty 10

## 2013-03-08 MED ORDER — AMOXICILLIN 500 MG PO CAPS: 500.0000 mg | ORAL_CAPSULE | Freq: Three times a day (TID) | ORAL | Status: DC

## 2013-03-08 MED ORDER — ONDANSETRON HCL 4 MG PO TABS
4.0000 mg | ORAL_TABLET | Freq: Once | ORAL | Status: AC
  Administered 2013-03-08: 4 mg via ORAL
  Filled 2013-03-08: qty 1

## 2013-03-08 MED ORDER — KETOROLAC TROMETHAMINE 60 MG/2ML IM SOLN
60.0000 mg | Freq: Once | INTRAMUSCULAR | Status: AC
  Administered 2013-03-08: 60 mg via INTRAMUSCULAR
  Filled 2013-03-08: qty 2

## 2013-03-08 NOTE — Discharge Instructions (Signed)
Toothache  Toothaches are usually caused by tooth decay (cavity). However, other causes of toothache include:  · Gum disease.  · Cracked tooth.  · Cracked filling.  · Injury.  · Jaw problem (temporo mandibular joint or TMJ disorder).  · Tooth abscess.  · Root sensitivity.  · Grinding.  · Eruption problems.  Swelling and redness around a painful tooth often means you have a dental abscess.  Pain medicine and antibiotics can help reduce symptoms, but you will need to see a dentist within the next few days to have your problem properly evaluated and treated. If tooth decay is the problem, you may need a filling or root canal to save your tooth. If the problem is more severe, your tooth may need to be pulled.  SEEK IMMEDIATE MEDICAL CARE IF:  · You cannot swallow.  · You develop severe swelling, increased redness, or increased pain in your mouth or face.  · You have a fever.  · You cannot open your mouth adequately.  Document Released: 10/17/2004 Document Revised: 12/02/2011 Document Reviewed: 12/07/2009  [employer]® Patient Information ©2014 [employer].

## 2013-03-08 NOTE — ED Provider Notes (Signed)
History     CSN: [id number]  Arrival date & time 03/08/13  0703   First MD Initiated Contact with Patient 03/08/13 [phone]      Chief Complaint  Patient presents with  . Dental Pain    (Consider location/radiation/quality/duration/timing/severity/associated sxs/prior treatment) Patient is a 42 y.o. male presenting with tooth pain. The history is provided by the patient.  Dental Pain Location:  Upper Quality:  Aching Severity:  Moderate Onset quality:  Gradual Duration:  6 weeks Timing:  Intermittent Progression:  Worsening Chronicity:  Chronic Context: dental caries and poor dentition   Relieved by:  Nothing Ineffective treatments:  NSAIDs Associated symptoms: gum swelling   Associated symptoms: no difficulty swallowing, no neck pain and no oral bleeding   Risk factors: lack of dental care   Risk factors comment:  Hx of substance abuse.   Past Medical History  Diagnosis Date  . Kidney stone   . Status post alcohol detoxification     Past Surgical History  Procedure Laterality Date  . Pleural scarification      History reviewed. No pertinent family history.  History  Substance Use Topics  . Smoking status: Current Every Day Smoker -- 0.50 packs/day    Types: Cigarettes  . Smokeless tobacco: Not on file  . Alcohol Use: No     Comment: former-last use x 3 months      Review of Systems  Constitutional: Negative for activity change.       All ROS Neg except as noted in HPI  HENT: Positive for dental problem. Negative for nosebleeds and neck pain.   Eyes: Negative for photophobia and discharge.  Respiratory: Negative for cough, shortness of breath and wheezing.   Cardiovascular: Negative for chest pain and palpitations.  Gastrointestinal: Negative for abdominal pain and blood in stool.  Genitourinary: Positive for flank pain. Negative for dysuria, frequency and hematuria.  Musculoskeletal: Negative for back pain and arthralgias.  Skin: Negative.    Neurological: Negative for dizziness, seizures and speech difficulty.  Psychiatric/Behavioral: Negative for hallucinations and confusion.    Allergies  Sulfa antibiotics  Home Medications   Current Outpatient Rx  Name  Route  Sig  Dispense  Refill  . ibuprofen (ADVIL,MOTRIN) 200 MG tablet   Oral   Take 800 mg by mouth daily as needed. For pain           BP 126/75  Pulse 59  Temp(Src) 98.1 F (36.7 C) (Oral)  Resp 18  SpO2 100%  Physical Exam  Nursing note and vitals reviewed. Constitutional: He is oriented to person, place, and time. He appears well-developed and well-nourished.  Non-toxic appearance.  HENT:  Head: Normocephalic.  Right Ear: Tympanic membrane and external ear normal.  Left Ear: Tympanic membrane and external ear normal.  Mouth/Throat: Uvula is midline. No oral lesions. Abnormal dentition. Dental caries present. No edematous.  Eyes: EOM and lids are normal. Pupils are equal, round, and reactive to light.  Neck: Normal range of motion. Neck supple. Carotid bruit is not present.  Cardiovascular: Normal rate, regular rhythm, normal heart sounds, intact distal pulses and normal pulses.   Pulmonary/Chest: Breath sounds normal. No respiratory distress.  Abdominal: Soft. Bowel sounds are normal. There is no tenderness. There is no guarding.  Musculoskeletal: Normal range of motion.  Lymphadenopathy:       Head (right side): No submandibular adenopathy present.       Head (left side): No submandibular adenopathy present.    He has no cervical adenopathy.  Neurological: He is alert and oriented to person, place, and time. He has normal strength. No cranial nerve deficit or sensory deficit.  Skin: Skin is warm and dry.  Psychiatric: He has a normal mood and affect. His speech is normal.    ED Course  Procedures (including critical care time)  Labs Reviewed - No data to display No results found.   No diagnosis found.    MDM  I have reviewed nursing  notes, vital signs, and all appropriate lab and imaging results for this patient. Pt has multiple dental caries. Deep cavity of the right upper gum. Some swelling of the gum. No evidence for Ludwig's Angina. Rx for mobic, tramadol, and penicillin given to the patient. Dental resources given to the patient.       Kathie Dike, PA-C 03/08/13 [phone]

## 2013-03-08 NOTE — ED Notes (Signed)
Pt c/o right upper dental pain. States broke off x 2 month ago. No insurance at this time to get dentist. Pain 8 at this time. Took motrin at home. Last does last night.

## 2013-03-08 NOTE — ED Notes (Signed)
When nurse ready to inject toradol pt said he did not want it. Stated it didn't help him anyway. EDPA aware

## 2013-03-08 NOTE — ED Provider Notes (Signed)
Medical screening examination/treatment/procedure(s) were performed by non-physician practitioner and as supervising physician I was immediately available for consultation/collaboration.  Raylan Hanton, MD 03/08/13 1539 

## 2013-03-18 IMAGING — CR DG KNEE COMPLETE 4+V*L*
4 series · 4 of 4 positions shown · non-contrast
Comparison: 05/05/2012

CLINICAL DATA: Left knee pain

LEFT KNEE - COMPLETE 4+ VIEW

[view not recorded (1 of 4)]
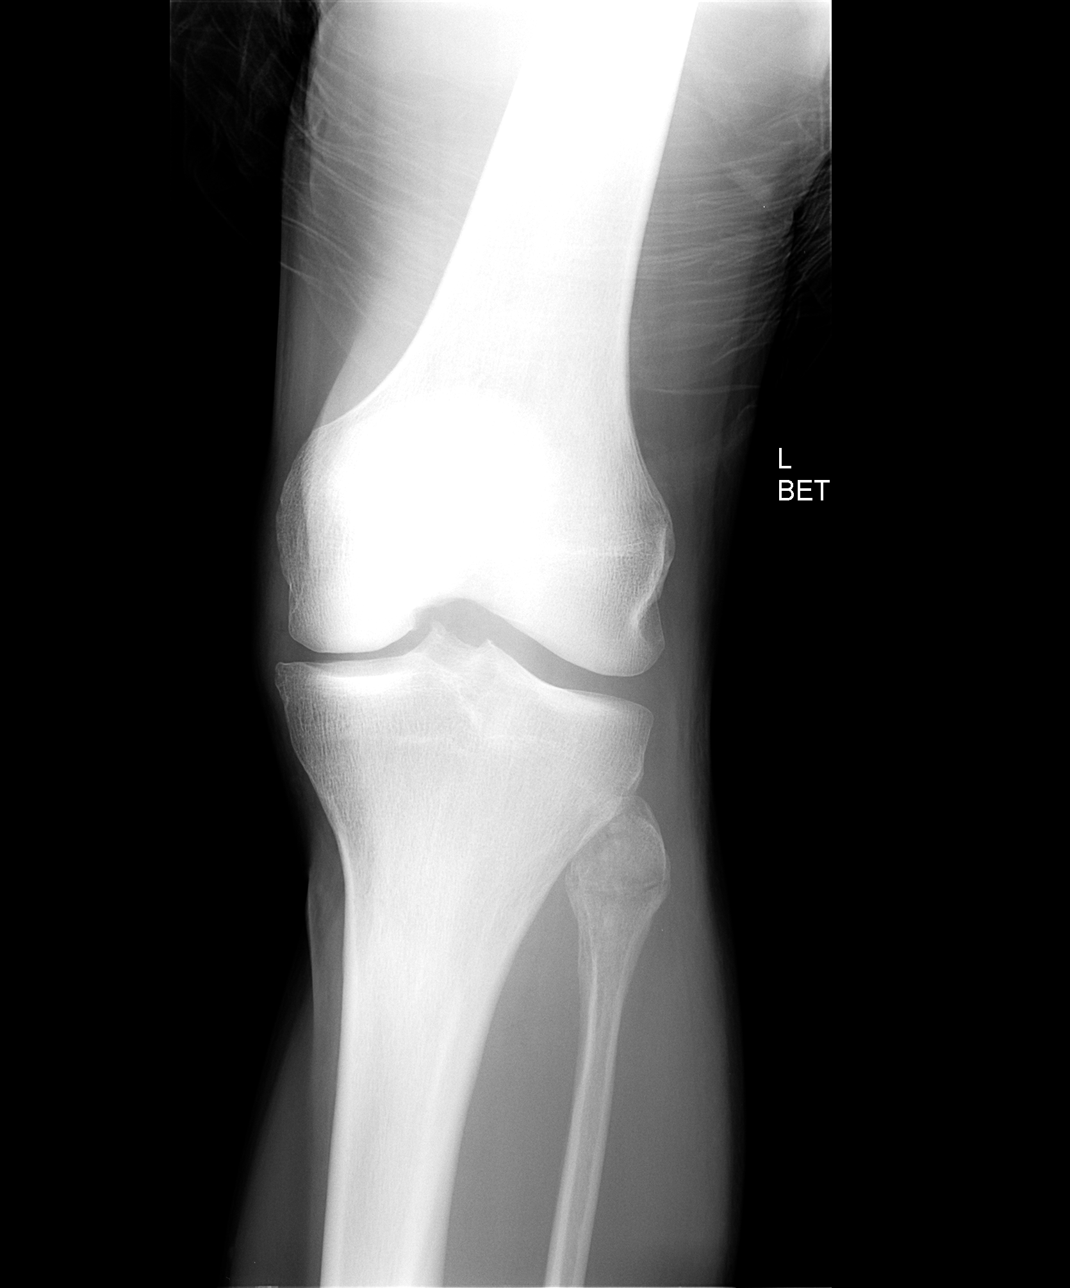

[view not recorded (2 of 4)]
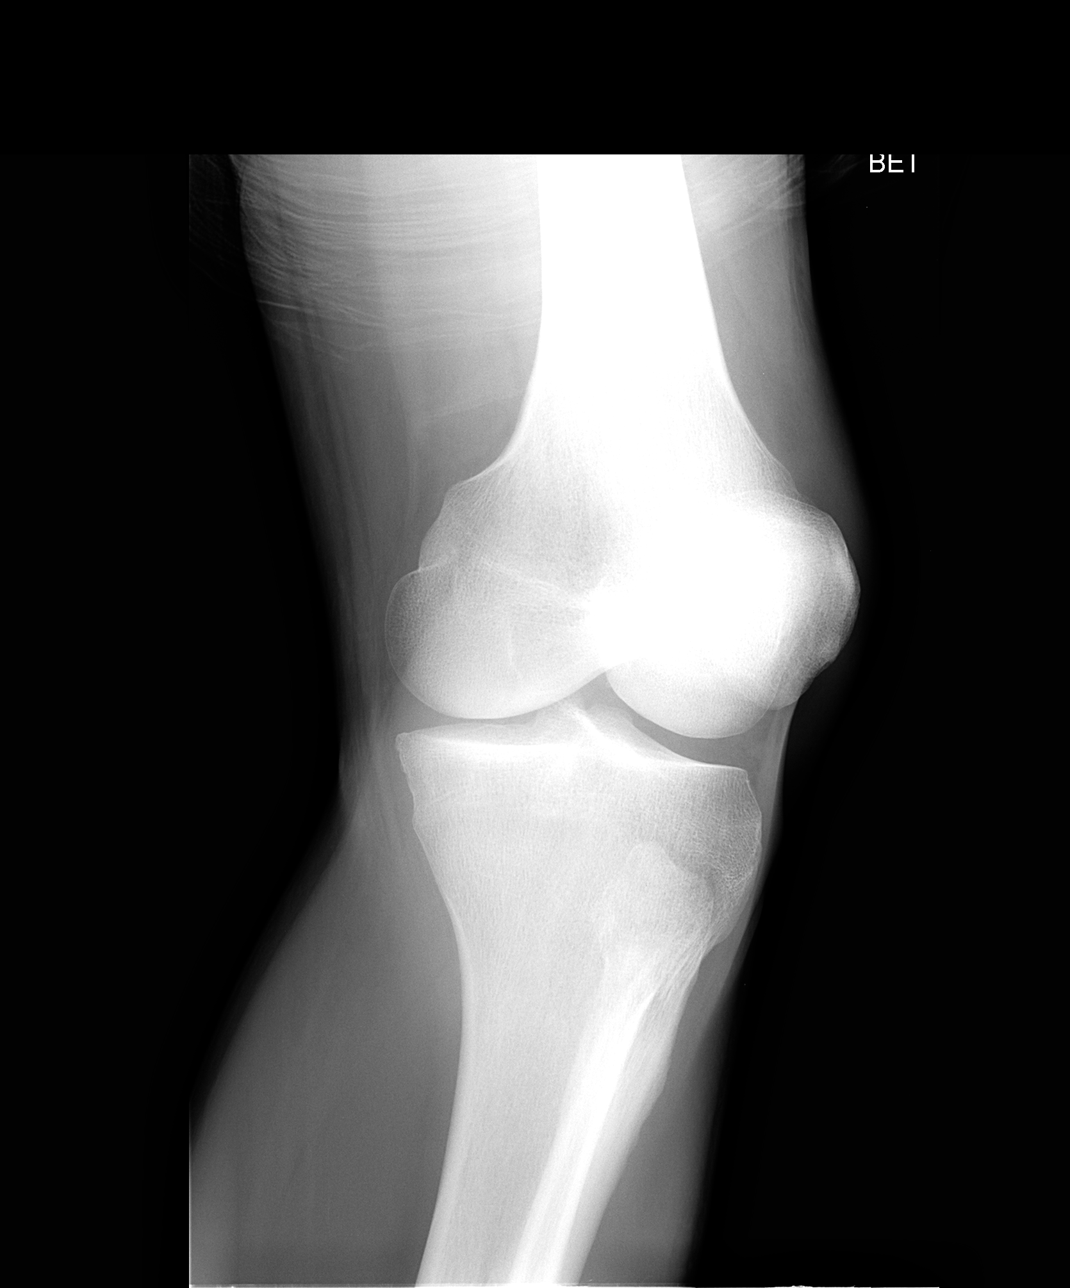

[view not recorded (3 of 4)]
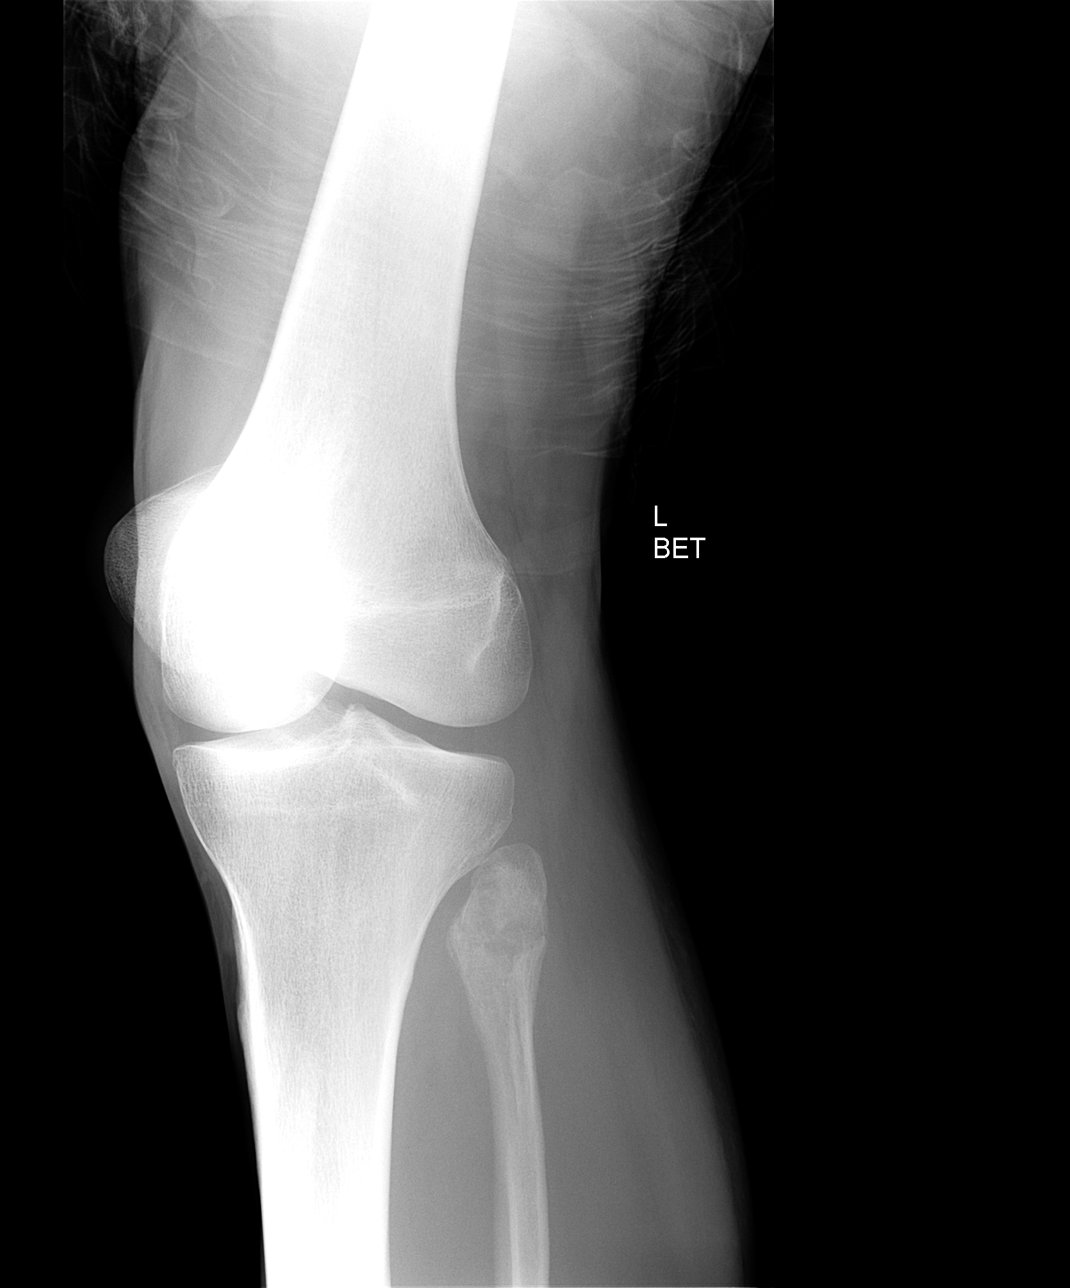

[view not recorded (4 of 4)]
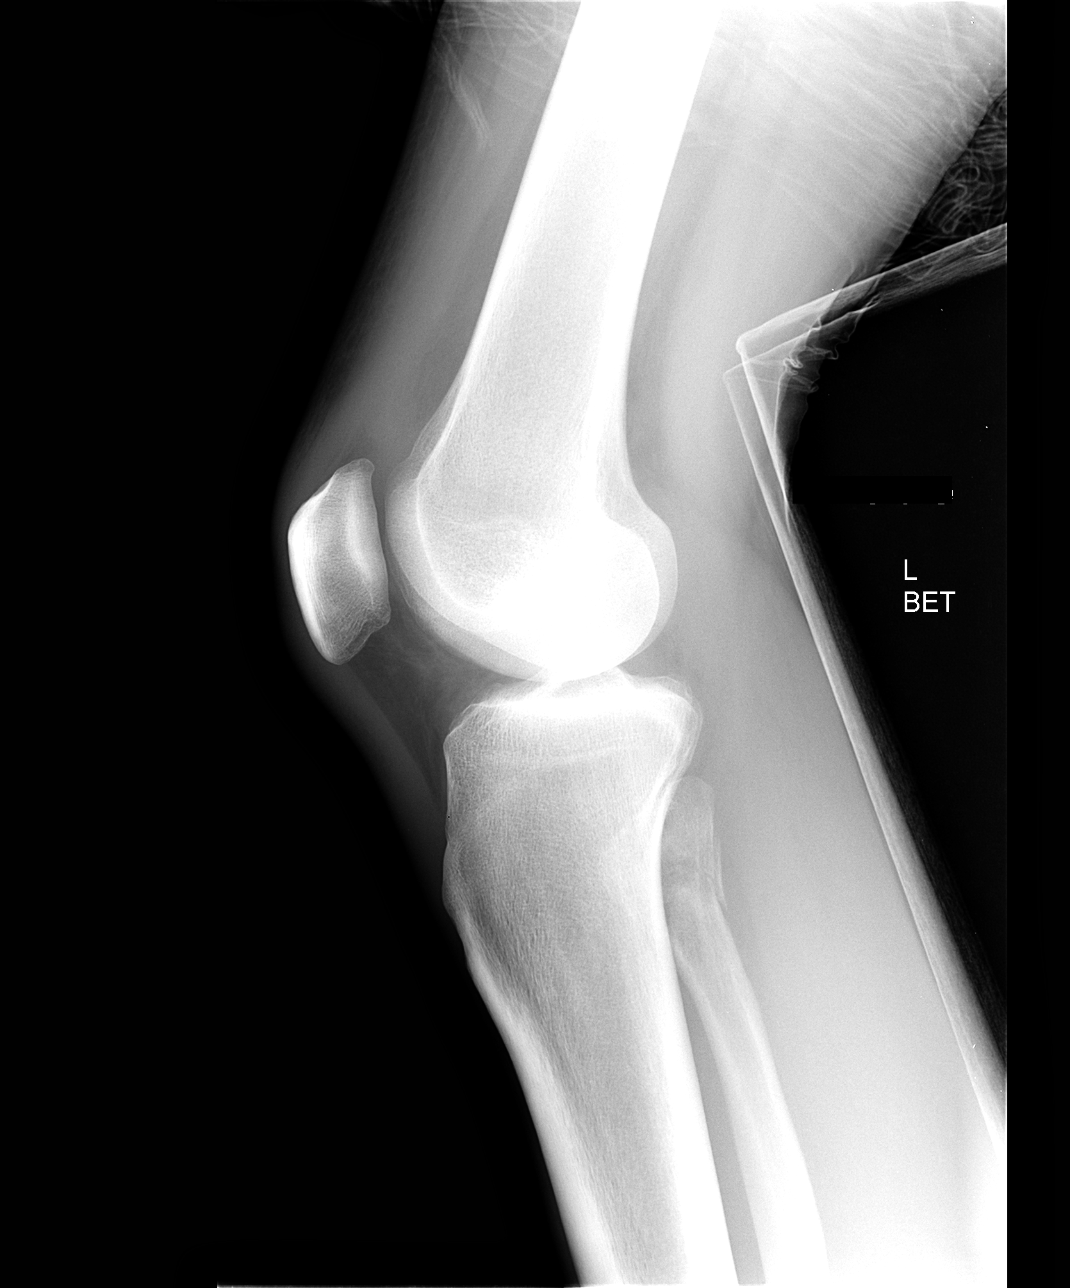

[4 of 4 positions shown; findings below may reference images not displayed]

FINDINGS: Proximal left fibular fracture again noted.  Mild medial
joint space loss.  No additional fracture or aggressive osseous
lesion.  Tiny joint effusion.
IMPRESSION: Proximal fibular fracture again noted.

Tiny joint effusion again noted.

## 2014-04-25 ENCOUNTER — Encounter (HOSPITAL_COMMUNITY): Payer: Self-pay | Admitting: Emergency Medicine

## 2014-04-25 ENCOUNTER — Emergency Department (HOSPITAL_COMMUNITY)
Admission: EM | Admit: 2014-04-25 | Discharge: 2014-04-25 | Disposition: A | Discharge status: Home / Self Care | Payer: Self-pay | Attending: Emergency Medicine | Admitting: Emergency Medicine

## 2014-04-25 DIAGNOSIS — Z87442 Personal history of urinary calculi: Secondary | ICD-10-CM | POA: Insufficient documentation

## 2014-04-25 DIAGNOSIS — F17200 Nicotine dependence, unspecified, uncomplicated: Secondary | ICD-10-CM | POA: Insufficient documentation

## 2014-04-25 DIAGNOSIS — K089 Disorder of teeth and supporting structures, unspecified: Secondary | ICD-10-CM | POA: Insufficient documentation

## 2014-04-25 DIAGNOSIS — K047 Periapical abscess without sinus: Secondary | ICD-10-CM | POA: Insufficient documentation

## 2014-04-25 DIAGNOSIS — K029 Dental caries, unspecified: Secondary | ICD-10-CM | POA: Insufficient documentation

## 2014-04-25 MED ORDER — PENICILLIN V POTASSIUM 500 MG PO TABS: 500.0000 mg | ORAL_TABLET | Freq: Four times a day (QID) | ORAL | Status: AC

## 2014-04-25 MED ORDER — TRAMADOL HCL 50 MG PO TABS: 50.0000 mg | ORAL_TABLET | Freq: Four times a day (QID) | ORAL | Status: DC | PRN

## 2014-04-25 NOTE — ED Notes (Signed)
Pt c/o upper right dental pain

## 2014-04-25 NOTE — Discharge Instructions (Signed)
Dental Caries °Dental caries (also called tooth decay) is the most common oral disease. It can occur at any age but is more common in children and young adults.  °HOW DENTAL CARIES DEVELOPS  °The process of decay begins when bacteria and foods (particularly sugars and starches) combine in your mouth to produce plaque. Plaque is a substance that sticks to the hard, outer surface of a tooth (enamel). The bacteria in plaque produce acids that attack enamel. These acids may also attack the root surface of a tooth (cementum) if it is exposed. Repeated attacks dissolve these surfaces and create holes in the tooth (cavities). If left untreated, the acids destroy the other layers of the tooth.  °RISK FACTORS °· Frequent sipping of sugary beverages.   °· Frequent snacking on sugary and starchy foods, especially those that easily get stuck in the teeth.   °· Poor oral hygiene.   °· Dry mouth.   °· Substance abuse such as methamphetamine abuse.   °· Broken or poor-fitting dental restorations.   °· Eating disorders.   °· Gastroesophageal reflux disease (GERD).   °· Certain radiation treatments to the head and neck. °SYMPTOMS °In the early stages of dental caries, symptoms are seldom present. Sometimes white, chalky areas may be seen on the enamel or other tooth layers. In later stages, symptoms may include: °· Pits and holes on the enamel. °· Toothache after sweet, hot, or cold foods or drinks are consumed. °· Pain around the tooth. °· Swelling around the tooth. °DIAGNOSIS  °Most of the time, dental caries is detected during a regular dental checkup. A diagnosis is made after a thorough medical and dental history is taken and the surfaces of your teeth are checked for signs of dental caries. Sometimes special instruments, such as lasers, are used to check for dental caries. Dental X-ray exams may be taken so that areas not visible to the eye (such as between the contact areas of the teeth) can be checked for cavities.    °TREATMENT  °If dental caries is in its early stages, it may be reversed with a fluoride treatment or an application of a remineralizing agent at the dental office. Thorough brushing and flossing at home is needed to aid these treatments. If it is in its later stages, treatment depends on the location and extent of tooth destruction:  °· If a small area of the tooth has been destroyed, the destroyed area will be removed and cavities will be filled with a material such as gold, silver amalgam, or composite resin.   °· If a large area of the tooth has been destroyed, the destroyed area will be removed and a cap (crown) will be fitted over the remaining tooth structure.   °· If the center part of the tooth (pulp) is affected, a procedure called a root canal will be needed before a filling or crown can be placed.   °· If most of the tooth has been destroyed, the tooth may need to be pulled (extracted). °HOME CARE INSTRUCTIONS °You can prevent, stop, or reverse dental caries at home by practicing good oral hygiene. Good oral hygiene includes: °· Thoroughly cleaning your teeth at least twice a day with a toothbrush and dental floss.   °· Using a fluoride toothpaste. A fluoride mouth rinse may also be used if recommended by your dentist or health care provider.   °· Restricting the amount of sugary and starchy foods and sugary liquids you consume.   °· Avoiding frequent snacking on these foods and sipping of these liquids.   °· Keeping regular visits with   a dentist for checkups and cleanings. °PREVENTION  °· Practice good oral hygiene. °· Consider a dental sealant. A dental sealant is a coating material that is applied by your dentist to the pits and grooves of teeth. The sealant prevents food from being trapped in them. It may protect the teeth for several years. °· Ask about fluoride supplements if you live in a community without fluorinated water or with water that has a low fluoride content. Use fluoride supplements  as directed by your dentist or health care provider. °· Allow fluoride varnish applications to teeth if directed by your dentist or health care provider. °Document Released: 06/01/2002 Document Revised: 01/24/2014 Document Reviewed: 09/11/2012 °ExitCare® Patient Information ©2015 [employer]. This information is not intended to replace advice given to you by your health care provider. Make sure you discuss any questions you have with your health care provider. ° °Emergency Department Resource Guide °1) Find a Doctor and Pay Out of Pocket °Although you won't have to find out who is covered by your insurance plan, it is a good idea to ask around and get recommendations. You will then need to call the office and see if the doctor you have chosen will accept you as a new patient and what types of options they offer for patients who are self-pay. Some doctors offer discounts or will set up payment plans for their patients who do not have insurance, but you will need to ask so you aren't surprised when you get to your appointment. ° °2) Contact Your Local Health Department °Not all health departments have doctors that can see patients for sick visits, but many do, so it is worth a call to see if yours does. If you don't know where your local health department is, you can check in your phone book. The CDC also has a tool to help you locate your state's health department, and many state websites also have listings of all of their local health departments. ° °3) Find a Walk-in Clinic °If your illness is not likely to be very severe or complicated, you may want to try a walk in clinic. These are popping up all over the country in pharmacies, drugstores, and shopping centers. They're usually staffed by nurse practitioners or physician assistants that have been trained to treat common illnesses and complaints. They're usually fairly quick and inexpensive. However, if you have serious medical issues or chronic medical  problems, these are probably not your best option. ° °No Primary Care Doctor: °- Call [facility] at  832-8000 - they can help you locate a primary care doctor that  accepts your insurance, provides certain services, etc. °- Physician Referral Service- 1-800-533-3463 ° °Chronic Pain Problems: °Organization         Address  Phone   Notes  °[facility]  (336) 297-2271 Patients need to be referred by their primary care doctor.  ° °Medication Assistance: °Organization         Address  Phone   Notes  °[place] [facility] [address] °[place], [place] [address] (336) 641-8030 --Must be a resident of [place] °-- Must have NO insurance coverage whatsoever (no Medicaid/ Medicare, etc.) °-- The pt. MUST have a primary care doctor that directs their care regularly and follows them in the community °  °[facility]  (866) 331-1348   °[employer]  (888) 892-1162   ° °Agencies that provide inexpensive medical care: °Organization           Address  Phone   Notes  °[facility]  (336) 832-8035   °[facility]    (336) 832-7272   °[facility] [address] °[place], [place] [address] (336) 832-4777   °[facility] [address], °[place] (336) 271-4999   °[facility]    (336) 373-0678   °[facility]    (336) 272-1050   °[facility] ° [address], [place] Phone:  (336) 832-4444, Fax:  (336) 832-4440 Hours of Operation:  9 am - 6 pm, M-F.  Also accepts Medicaid/Medicare and self-pay.  °[facility] ° [address], [place] Phone: (336) 832-3150, Fax: (336) 832-3151. Hours of Operation:  8:30 am - 5:30 pm, M-F.  Also accepts Medicaid and self-pay.  °[facility] [place] [address], [place] Phone: (336) 878-6027   °[facility] [address], [place], [place] (336)723-1848, Ext. 123  Mondays & Thursdays: 7-9 AM.  First 15 patients are seen on a first come, first serve basis. °  ° °Medicaid-accepting [place] Providers: ° °Organization         Address  Phone   Notes  °[facility] [address], [place] (336) 641-2100 Also accepts self-pay patients.  °[facility] [address], [place] ° (336) 856-9996   °[facility] [address], [place] (336) 288-8857   °[facility] [address] [place] [address], [place] (336) 299-7000   °Veita Bland [address], [place]  ° (336) 373-1557 Only accepts [place] Access Medicaid patients after they have their name applied to their card.  ° °Self-Pay (no insurance) in [place]: ° °Organization         Address  Phone   Notes  °Sickle Cell Patients, [facility] [address], [place] (336) 832-1970   °[facility] [address], [place] (336) 832-4400   ° [facility] ° [address], [place] (336) 992-4800   °[facility]/Dr. Osei-Bonsu ° [address] [place] [address], [place] or [address], [place] (336) 841-8500 Phone number for both [place] and [place] locations is the same.  °[facility] [address], [place] (336) 299-0000   °[facility] [address] [place] [address], [place] or [address] (336) 852-7530 °(336) 878-2260   °[facility] [address], [place] (336) 350-1642, phone; (336) 294-5005, fax Sees patients 1st and 3rd Saturday of every month.  Must not qualify for public or private insurance (i.e. Medicaid, Medicare, [place] Health Choice, Veterans' Benefits) • Household income should be no more than 200% of the poverty level •The clinic cannot treat you if you are pregnant or think you are pregnant • Sexually transmitted diseases are not treated at  the clinic.  ° ° °Dental Care: °Organization         Address  Phone  Notes  °[facility] [address], [place] (336) 641-6152 Accepts children up to age 21 who are enrolled in Medicaid or [place] Health Choice; pregnant women with a Medicaid card; and children who have applied for Medicaid or  Health Choice, but were declined, whose parents can pay a reduced fee at time   of service.  °[facility] [place]  [address], [place] (336) 641-7733 Accepts children up to age 21 who are enrolled in Medicaid or [place] Health Choice; pregnant women with a Medicaid card; and children who have applied for Medicaid or [place] Health Choice, but were declined, whose parents can pay a reduced fee at time of service.  °[facility] ° [address], [place] (336) 641-4533 Patients are seen by appointment only. Walk-ins are not accepted. [facility] will see patients 18 years of age and older. °Monday - Tuesday (8am-5pm) °Most Wednesdays (8:30-5pm) °$30 per visit, cash only  °[facility] ° [address], [place] (336) 641-4533 Patients are seen by appointment only. Walk-ins are not accepted. [facility] will see patients 18 years of age and older. °One Wednesday Evening (Monthly: Volunteer Based).  $30 per visit, cash only  °[facility]  (919) 537-3737 for adults; Children under age 4, call Graduate Pediatric Dentistry at (919) [phone]. Children aged 4-14, please call (919) [phone] to request a pediatric application. ° Dental services are provided in all areas of dental care including fillings, crowns and bridges, complete and partial dentures, implants, gum treatment, root canals, and extractions. Preventive care is also provided. Treatment is provided to both adults and children. °Patients are selected via a lottery and there is often a  waiting list. °  °[facility] [address], °[place] ° (336) 763-8833 [email or web address] °  °[facility] [address], [place], [place] (336)723-1848, Ext. 123 Second and Fourth Thursday of each month, opens at 6:30 AM; Clinic ends at 9 AM.  Patients are seen on a first-come first-served basis, and a limited number are seen during each clinic.  ° °[facility] ° [address], [place], [place] (336) 723-7904   Eligibility Requirements °You must have lived in [place], [place], or [place] counties for at least the last three months. °  You cannot be eligible for state or federal sponsored healthcare insurance, including Veterans Administration, Medicaid, or Medicare. °  You generally cannot be eligible for healthcare insurance through your employer.  °  How to apply: °Eligibility screenings are held every Tuesday and Wednesday afternoon from 1:00 pm until 4:00 pm. You do not need an appointment for the interview!  °[facility] [address], [place], [place] 336-631-2330   °[facility]  336-342-8273   °[facility]  336-703-3100   °[facility]  336-570-6415   ° °Behavioral Health Resources in the Community: °Intensive Outpatient Programs °Organization         Address  Phone  Notes  °[facility] [address], [place], [place] 336-878-6098   °[facility] [address], [place], [place] 336-832-9800   °[facility] [address], [place], [place] ° 336-882-2125   °[facility] [address],  °, [place] 1-800-853-5163 or 336-641-4981   °Substance Abuse Resources °Organization         Address  Phone  Notes  °[facility]  336-882-2125   °[facility]  336-784-9470   °[facility]  336-285-9073   °[facility]  336-845-3988   °Residential & Outpatient Substance Abuse  Program  1-800-659-3381   °Psychological Services °Organization         Address    Phone  Notes  °[facility]  336- 832-9600   °[facility]  336- 378-7881   °[facility] [address], [place] 1-800-853-5163 or 336-641-4981   ° °Mobile Crisis Teams °Organization         Address  Phone  Notes  °[facility], Mobile Crisis Care Unit  1-877-626-1772   °Assertive °[facility] ° [address] [place], [place] 336-834-9664   °Sharon DeEsch [address] °[place] 336-554-5454   ° °Self-Help/Support Groups °Organization         Address  Phone             Notes  °[facility] - variety of support groups  336- 373-1402 Call for more information  °Narcotics Anonymous (NA), [facility] [address], °[place]  2 meetings at this location  ° °Residential Treatment Programs °Organization         Address  Phone  Notes  °[facility] [address],    °[place]  1-866-801-8205   °[facility] ° [address], [place], [place] 704-293-8524   °[facility] [address], [place] 336-845-3988 Admissions: 8am-3pm M-F  °[facility] [address]    °[place], [place] 336-841-1104   °[facility] [address], [place], [place] 336-379-7146   °[facility] [address]  °[place], [place] 336-285-9073   °[facility] - Intensive Outpatient [address], , [place] 336-852-3033   °[facility] [address]  °[place], [place] 1-877-615-2722 or 336-784-9470   °[facility] [address] [place], [place] 336-227-7417 Accepts Medicaid  °[facility] [address]  ° [place] 1-800-659-3381 Substance Abuse/Addiction Treatment  ° °[place] Behavioral Health Resources °Organization          Address  Phone  Notes  °[facility]  (888) 581-9988   °Julie Brannon, PhD [address] [place], [place]   (336) 349-5553 or (336) 951-0000   °[facility]   [address] °[place], [place] (336) 349-4454   °[facility] [address], [place], [place] (336) 342-8316 Insurance/Medicaid/sponsorship through [facility]  °[facility] [address]                                    [place], [place] (336) 342-8316 Therapy/tele-psych/case  °[facility] [address]  ° [place],  (336) 349-2233    °Dr. Arfeen  (336) 349-4544   °[facility] [place]  [employer] [place] [facility] 1) [address], [place] °2) [address], [place] °3)  [address], [place] (336) 349-3220 °(336) 342-7768 ° °(336) 342-8140   °[place] [facility] (336) 342-1394 or (336) 342-3537 (After Hours)    ° ° ° °

## 2014-04-25 NOTE — ED Provider Notes (Signed)
CSN: [id number]     Arrival date & time 04/25/14  [id number] History   First MD Initiated Contact with Patient 04/25/14 [phone]     Chief Complaint  Patient presents with  . Dental Pain     (Consider location/radiation/quality/duration/timing/severity/associated sxs/prior Treatment) Patient is a 43 y.o. male presenting with tooth pain. The history is provided by the patient.  Dental Pain Location:  Lower Lower teeth location:  26/RL lateral incisor Quality:  Dull Severity:  Moderate Onset quality:  Gradual Duration:  3 days Timing:  Constant Progression:  Worsening Chronicity:  Recurrent Context: dental caries   Context: not dental fracture and not enamel fracture   Relieved by:  Nothing Worsened by:  Nothing tried Associated symptoms: no drooling, no facial pain and no fever     Past Medical History  Diagnosis Date  . Kidney stone   . Status post alcohol detoxification    Past Surgical History  Procedure Laterality Date  . Pleural scarification     No family history on file. History  Substance Use Topics  . Smoking status: Current Every Day Smoker -- 0.50 packs/day    Types: Cigarettes  . Smokeless tobacco: Not on file  . Alcohol Use: No     Comment: former-last use x 3 months    Review of Systems  Constitutional: Negative for fever.  HENT: Negative for drooling.   Respiratory: Negative for cough and shortness of breath.   Cardiovascular: Negative for chest pain and leg swelling.  Gastrointestinal: Negative for vomiting and abdominal pain.  All other systems reviewed and are negative.     Allergies  Sulfa antibiotics  Home Medications   Prior to Admission medications   Not on File   BP 124/83  Pulse 87  Temp(Src) 97.9 F (36.6 C) (Oral)  Resp 18  SpO2 98% Physical Exam  Constitutional: He is oriented to person, place, and time. He appears well-developed and well-nourished. No distress.  HENT:  Head: Normocephalic and atraumatic.  Mouth/Throat:  Oropharynx is clear and moist. Abnormal dentition. Dental caries present. No oropharyngeal exudate.    Eyes: EOM are normal. Pupils are equal, round, and reactive to light.  Neck: Normal range of motion. Neck supple.  Cardiovascular: Normal rate and regular rhythm.  Exam reveals no friction rub.   No murmur heard. Pulmonary/Chest: Effort normal and breath sounds normal. No respiratory distress. He has no wheezes. He has no rales.  Abdominal: He exhibits no distension. There is no tenderness. There is no rebound.  Musculoskeletal: Normal range of motion. He exhibits no edema.  Neurological: He is alert and oriented to person, place, and time.  Skin: He is not diaphoretic.    ED Course  Procedures (including critical care time) Labs Review Labs Reviewed - No data to display  Imaging Review No results found.   EKG Interpretation None      MDM   Final diagnoses:  Dental caries  Periapical abscess    21M here with dental pain. Progressively worsening over past few days. No fevers. Stated last week had some facial swelling. AFVSS here. R mandibular lower incisor with severe tooth decay down into gingiva. No abscess. No facial swelling appreciated here. Airway intact and patent. Submental space soft. No elevation of floor of mouth. Will give tramadol and penicillin. Dental f/u encouarged.    Dagmar HaitWilliam Dushaun Okey, MD 04/25/14 [phone]

## 2015-05-30 ENCOUNTER — Emergency Department (HOSPITAL_COMMUNITY)
Admission: EM | Admit: 2015-05-30 | Discharge: 2015-05-30 | Disposition: A | Discharge status: Home / Self Care | Payer: Self-pay | Attending: Emergency Medicine | Admitting: Emergency Medicine

## 2015-05-30 ENCOUNTER — Encounter (HOSPITAL_COMMUNITY): Payer: Self-pay | Admitting: Emergency Medicine

## 2015-05-30 DIAGNOSIS — Z720 Tobacco use: Secondary | ICD-10-CM | POA: Insufficient documentation

## 2015-05-30 DIAGNOSIS — R110 Nausea: Secondary | ICD-10-CM | POA: Insufficient documentation

## 2015-05-30 DIAGNOSIS — R197 Diarrhea, unspecified: Secondary | ICD-10-CM | POA: Insufficient documentation

## 2015-05-30 NOTE — ED Notes (Signed)
Called twice pt not in the waiting area

## 2015-05-30 NOTE — ED Notes (Signed)
Onset this morning abdominal pain, diarrhea, nausea, no vomiting

## 2015-09-12 ENCOUNTER — Encounter (HOSPITAL_COMMUNITY): Payer: Self-pay | Admitting: Emergency Medicine

## 2015-09-12 ENCOUNTER — Emergency Department (HOSPITAL_COMMUNITY)
Admission: EM | Admit: 2015-09-12 | Discharge: 2015-09-12 | Disposition: A | Discharge status: Home / Self Care | Payer: Self-pay | Attending: Emergency Medicine | Admitting: Emergency Medicine

## 2015-09-12 DIAGNOSIS — Y9289 Other specified places as the place of occurrence of the external cause: Secondary | ICD-10-CM | POA: Insufficient documentation

## 2015-09-12 DIAGNOSIS — S39012A Strain of muscle, fascia and tendon of lower back, initial encounter: Secondary | ICD-10-CM | POA: Insufficient documentation

## 2015-09-12 DIAGNOSIS — Z87442 Personal history of urinary calculi: Secondary | ICD-10-CM | POA: Insufficient documentation

## 2015-09-12 DIAGNOSIS — F17210 Nicotine dependence, cigarettes, uncomplicated: Secondary | ICD-10-CM | POA: Insufficient documentation

## 2015-09-12 DIAGNOSIS — Y998 Other external cause status: Secondary | ICD-10-CM | POA: Insufficient documentation

## 2015-09-12 DIAGNOSIS — Z79899 Other long term (current) drug therapy: Secondary | ICD-10-CM | POA: Insufficient documentation

## 2015-09-12 DIAGNOSIS — Y9389 Activity, other specified: Secondary | ICD-10-CM | POA: Insufficient documentation

## 2015-09-12 DIAGNOSIS — X58XXXA Exposure to other specified factors, initial encounter: Secondary | ICD-10-CM | POA: Insufficient documentation

## 2015-09-12 MED ORDER — IBUPROFEN 600 MG PO TABS: 600.0000 mg | ORAL_TABLET | Freq: Four times a day (QID) | ORAL | Status: DC | PRN

## 2015-09-12 MED ORDER — METHOCARBAMOL 500 MG PO TABS: 500.0000 mg | ORAL_TABLET | Freq: Three times a day (TID) | ORAL | Status: DC | PRN

## 2015-09-12 MED ORDER — KETOROLAC TROMETHAMINE 60 MG/2ML IM SOLN
60.0000 mg | Freq: Once | INTRAMUSCULAR | Status: DC
  Filled 2015-09-12: qty 2

## 2015-09-12 MED ORDER — METHOCARBAMOL 500 MG PO TABS
1000.0000 mg | ORAL_TABLET | Freq: Once | ORAL | Status: AC
  Administered 2015-09-12: 1000 mg via ORAL
  Filled 2015-09-12: qty 2

## 2015-09-12 NOTE — Discharge Instructions (Signed)

## 2015-09-12 NOTE — ED Provider Notes (Signed)
CSN: [id number]     Arrival date & time 09/12/15  4098 History  By signing my name below, I, Gwenyth Ober, attest that this documentation has been prepared under the direction and in the presence of Loren Racer, MD.  Electronically Signed: Gwenyth Ober, ED Scribe. 09/12/2015. 10:14 AM.   Chief Complaint  Patient presents with  . Flank Pain    right   The history is provided by the patient. No language interpreter was used.    HPI Comments: Devin Hodge is a 44 y.o. male who presents to the Emergency Department complaining of constant, gradual onset, moderate, non-radiating right-sided lower back pain that started yesterday. Pt reports onset of pain started after feeling a "pop" while pulling wire. He denies abdominal pain, nausea, vomiting, leg swelling, gait problems, bladder or bowel incontinence and fever.   Past Medical History  Diagnosis Date  . Kidney stone   . Status post alcohol detoxification    Past Surgical History  Procedure Laterality Date  . Pleural scarification     History reviewed. No pertinent family history. Social History  Substance Use Topics  . Smoking status: Current Every Day Smoker -- 0.50 packs/day    Types: Cigarettes  . Smokeless tobacco: None  . Alcohol Use: No     Comment: former-last use x 3 months    Review of Systems  Constitutional: Negative for fever and chills.  Cardiovascular: Negative for leg swelling.  Gastrointestinal: Negative for nausea, vomiting and abdominal pain.  Genitourinary: Negative for difficulty urinating.  Musculoskeletal: Positive for myalgias and back pain. Negative for joint swelling and gait problem.  Skin: Negative for rash and wound.  Neurological: Negative for weakness and numbness.  All other systems reviewed and are negative.  Allergies  Sulfa antibiotics  Home Medications   Prior to Admission medications   Medication Sig Start Date End Date Taking? Authorizing Provider  oxymetazoline (AFRIN)  0.05 % nasal spray Place 1 spray into both nostrils 2 (two) times daily.   Yes Historical Provider, MD  phenylephrine (NEO-SYNEPHRINE) 0.5 % nasal solution Place 1 drop into both nostrils every 6 (six) hours as needed for congestion.   Yes Historical Provider, MD  ibuprofen (ADVIL,MOTRIN) 600 MG tablet Take 1 tablet (600 mg total) by mouth every 6 (six) hours as needed for moderate pain. 09/12/15   Loren Racer, MD  methocarbamol (ROBAXIN) 500 MG tablet Take 1 tablet (500 mg total) by mouth every 8 (eight) hours as needed for muscle spasms. 09/12/15   Loren Racer, MD  traMADol (ULTRAM) 50 MG tablet Take 1 tablet (50 mg total) by mouth every 6 (six) hours as needed. Patient not taking: Reported on 09/12/2015 04/25/14   Elwin Mocha, MD   BP 120/81 mmHg  Pulse 68  Temp(Src) 98 F (36.7 C) (Oral)  Resp 16  Ht 6' (1.829 m)  Wt 175 lb (79.379 kg)  BMI 23.73 kg/m2  SpO2 100% Physical Exam  Constitutional: He is oriented to person, place, and time. He appears well-developed and well-nourished. No distress.  HENT:  Head: Normocephalic and atraumatic.  Mouth/Throat: Oropharynx is clear and moist.  Eyes: EOM are normal. Pupils are equal, round, and reactive to light.  Neck: Normal range of motion. Neck supple.  Cardiovascular: Normal rate and regular rhythm.   Pulmonary/Chest: Effort normal and breath sounds normal. No respiratory distress. He has no wheezes. He has no rales. He exhibits no tenderness.  Abdominal: Soft. Bowel sounds are normal. He exhibits no distension and no mass.  There is no tenderness. There is no rebound and no guarding.  Musculoskeletal: Normal range of motion. He exhibits tenderness. He exhibits no edema.  No midline thoracic or lumbar tenderness. Patient has right-sided paraspinal lumbar tenderness to palpation. There is no obvious injury. Negative straight leg raise.  Neurological: He is alert and oriented to person, place, and time.  Patient is a benign without  difficulty. 5/5 motor in all extremities. Sensation is fully intact.  Skin: Skin is warm and dry. No rash noted. No erythema.  Psychiatric: He has a normal mood and affect. His behavior is normal.  Nursing note and vitals reviewed.   ED Course  Procedures  DIAGNOSTIC STUDIES: Oxygen Saturation is 100% on RA, normal by my interpretation.    COORDINATION OF CARE: 10:14 AM Discussed suspicion for muscle strain and treatment plan with pt which includes pain management. Pt agreed to plan.  Labs Review Labs Reviewed - No data to display  Imaging Review No results found.   EKG Interpretation None      MDM   Final diagnoses:  Lumbar strain, initial encounter    I personally performed the services described in this documentation, which was scribed in my presence. The recorded information has been reviewed and is accurate.   History and exam consistent with lumbar strain. No red flag signs or symptoms. We'll treat with NSAIDs and muscle relaxants. Return precautions given.   Loren Raceravid Bo Teicher, MD 09/12/15 [phone]

## 2015-09-12 NOTE — ED Notes (Addendum)
Pain to right flank, rates pain 8/10.  Pt says he was pulling wires yesterday and felt pain.

## 2015-09-12 NOTE — ED Notes (Signed)
Patient with no complaints at this time. Respirations even and unlabored. Skin warm/dry. Discharge instructions reviewed with patient at this time. Patient given opportunity to voice concerns/ask questions. Patient discharged at this time and left Emergency Department with steady gait.   

## 2015-11-27 ENCOUNTER — Telehealth: Payer: Self-pay | Admitting: *Deleted

## 2015-11-27 NOTE — Telephone Encounter (Signed)
Per Dr. Orvan Falconerampbell. Andree CossHowell, Anayah Arvanitis M, RN

## 2015-11-29 ENCOUNTER — Emergency Department (HOSPITAL_COMMUNITY)
Admission: EM | Admit: 2015-11-29 | Discharge: 2015-11-29 | Disposition: A | Discharge status: Home / Self Care | Payer: Self-pay | Attending: Emergency Medicine | Admitting: Emergency Medicine

## 2015-11-29 ENCOUNTER — Encounter (HOSPITAL_COMMUNITY): Payer: Self-pay | Admitting: Emergency Medicine

## 2015-11-29 DIAGNOSIS — M79661 Pain in right lower leg: Secondary | ICD-10-CM | POA: Insufficient documentation

## 2015-11-29 DIAGNOSIS — Z87442 Personal history of urinary calculi: Secondary | ICD-10-CM | POA: Insufficient documentation

## 2015-11-29 DIAGNOSIS — F17210 Nicotine dependence, cigarettes, uncomplicated: Secondary | ICD-10-CM | POA: Insufficient documentation

## 2015-11-29 MED ORDER — IBUPROFEN 600 MG PO TABS: 600.0000 mg | ORAL_TABLET | Freq: Four times a day (QID) | ORAL | Status: DC | PRN

## 2015-11-29 MED ORDER — TRAMADOL HCL 50 MG PO TABS: 50.0000 mg | ORAL_TABLET | Freq: Four times a day (QID) | ORAL | Status: DC | PRN

## 2015-11-29 NOTE — ED Provider Notes (Signed)
CSN: [id number]     Arrival date & time 11/29/15  1154 History   First MD Initiated Contact with Patient 11/29/15 1210     Chief Complaint  Patient presents with  . Leg Pain     (Consider location/radiation/quality/duration/timing/severity/associated sxs/prior Treatment) The history is provided by the patient.   Devin Hodge is a 45 y.o. male presenting with right calf pain which started suddenly yesterday when walking his dog.  He felt a popping sensation, at first thought he was just struck by his dogs tail but as the day progressed the pain has worsened along with development of swelling of the lower gastrocnemius muscle.  His pain is worsened with ankle flex/ext and with weight bearing. He has applied ice prior to arrival, no other treatments.    Past Medical History  Diagnosis Date  . Kidney stone   . Status post alcohol detoxification    Past Surgical History  Procedure Laterality Date  . Pleural scarification     History reviewed. No pertinent family history. Social History  Substance Use Topics  . Smoking status: Current Every Day Smoker -- 0.50 packs/day    Types: Cigarettes  . Smokeless tobacco: None  . Alcohol Use: No     Comment: former-last use x 3 months    Review of Systems  Constitutional: Negative.   Respiratory: Negative.   Cardiovascular: Negative.   Musculoskeletal: Positive for myalgias. Negative for joint swelling and arthralgias.  Skin: Negative for wound.  Neurological: Negative for weakness and numbness.      Allergies  Sulfa antibiotics  Home Medications   Prior to Admission medications   Medication Sig Start Date End Date Taking? Authorizing Provider  ibuprofen (ADVIL,MOTRIN) 600 MG tablet Take 1 tablet (600 mg total) by mouth every 6 (six) hours as needed. 11/29/15   Burgess Amor, PA-C  methocarbamol (ROBAXIN) 500 MG tablet Take 1 tablet (500 mg total) by mouth every 8 (eight) hours as needed for muscle spasms. 09/12/15   Loren Racer, MD  oxymetazoline (AFRIN) 0.05 % nasal spray Place 1 spray into both nostrils 2 (two) times daily.    Historical Provider, MD  phenylephrine (NEO-SYNEPHRINE) 0.5 % nasal solution Place 1 drop into both nostrils every 6 (six) hours as needed for congestion.    Historical Provider, MD  traMADol (ULTRAM) 50 MG tablet Take 1 tablet (50 mg total) by mouth every 6 (six) hours as needed. 11/29/15   Burgess Amor, PA-C   BP 131/80 mmHg  Pulse 64  Temp(Src) 97.8 F (36.6 C) (Oral)  Resp 16  Ht 6' (1.829 m)  Wt 81.647 kg  BMI 24.41 kg/m2  SpO2 99% Physical Exam  Constitutional: He appears well-developed and well-nourished.  HENT:  Head: Atraumatic.  Neck: Normal range of motion.  Cardiovascular:  Pulses:      Dorsalis pedis pulses are 2+ on the right side, and 2+ on the left side.  Pulses equal bilaterally  Musculoskeletal: He exhibits tenderness.  ttp medial right lower posterior calf beneath area of  gastrocnemius with no obvious deformity but feels softer the corresponding site left leg.  There is edema compared to left.  Achilles tender proximally, but no palpable defect.  Calf is soft. Negative Thompson test.   Neurological: He is alert. He has normal strength. He displays normal reflexes. No sensory deficit.  Skin: Skin is warm and dry.  Psychiatric: He has a normal mood and affect.    ED Course  Procedures (including critical care time) Labs  Review Labs Reviewed - No data to display  Imaging Review No results found. I have personally reviewed and evaluated these images and lab results as part of my medical decision-making.   EKG Interpretation None      MDM   Final diagnoses:  Calf pain, right    Pt with possible partial calf muscle tear, but no palpable gastroc deformity. ? Soleus involvement. Pt discussed with Dr. Romeo AppleHarrison who advised ace wrap, weight bearing when tolerated.  Crutches, ice.  Plan f/u in 2 weeks.  Pt understands and agrees with plan.  Prescribed  ibuprofen, tramadol.  Pt to call for f/u appt.     Burgess AmorJulie Loa Idler, PA-C 11/29/15 2202  Samuel JesterKathleen McManus, DO 12/04/15 [phone]

## 2015-11-29 NOTE — Discharge Instructions (Signed)
Musculoskeletal Pain Musculoskeletal pain is muscle and boney aches and pains. These pains can occur in any part of the body. Your caregiver may treat you without knowing the cause of the pain. They may treat you if blood or urine tests, X-rays, and other tests were normal.  CAUSES There is often not a definite cause or reason for these pains. These pains may be caused by a type of germ (virus). The discomfort may also come from overuse. Overuse includes working out too hard when your body is not fit. Boney aches also come from weather changes. Bone is sensitive to atmospheric pressure changes. HOME CARE INSTRUCTIONS   Ask when your test results will be ready. Make sure you get your test results.  Only take over-the-counter or prescription medicines for pain, discomfort, or fever as directed by your caregiver. If you were given medications for your condition, do not drive, operate machinery or power tools, or sign legal documents for 24 hours. Do not drink alcohol. Do not take sleeping pills or other medications that may interfere with treatment.  Continue all activities unless the activities cause more pain. When the pain lessens, slowly resume normal activities. Gradually increase the intensity and duration of the activities or exercise.  During periods of severe pain, bed rest may be helpful. Lay or sit in any position that is comfortable.  Putting ice on the injured area.  Put ice in a bag.  Place a towel between your skin and the bag.  Leave the ice on for 15 to 20 minutes, 3 to 4 times a day.  Follow up with your caregiver for continued problems and no reason can be found for the pain. If the pain becomes worse or does not go away, it may be necessary to repeat tests or do additional testing. Your caregiver may need to look further for a possible cause. SEEK IMMEDIATE MEDICAL CARE IF:  You have pain that is getting worse and is not relieved by medications.  You develop chest pain  that is associated with shortness or breath, sweating, feeling sick to your stomach (nauseous), or throw up (vomit).  Your pain becomes localized to the abdomen.  You develop any new symptoms that seem different or that concern you. MAKE SURE YOU:   Understand these instructions.  Will watch your condition.  Will get help right away if you are not doing well or get worse.   This information is not intended to replace advice given to you by your health care provider. Make sure you discuss any questions you have with your health care provider.   Document Released: 09/09/2005 Document Revised: 12/02/2011 Document Reviewed: 05/14/2013 Elsevier Interactive Patient Education [employer]2016 [employer]   As discussed, you may simply have a muscle strain or spasm in your calf, but we can't completely rule out a partial tear. Use ice , ace wrap and crutches, remember - activity as tolerated.  Do not bear weight on this leg if it worsens your symptoms.  Continue with ice therapy for the next 3 days.

## 2015-11-29 NOTE — ED Notes (Signed)
Pt states he went to run with the dog yesterday and felt something pop in his right calf.  Painful to walk on it.

## 2015-12-18 ENCOUNTER — Ambulatory Visit: Payer: Self-pay | Admitting: Internal Medicine

## 2016-01-29 ENCOUNTER — Encounter (HOSPITAL_COMMUNITY): Payer: Self-pay | Admitting: *Deleted

## 2016-01-29 ENCOUNTER — Emergency Department (HOSPITAL_COMMUNITY)
Admission: EM | Admit: 2016-01-29 | Discharge: 2016-01-29 | Discharge status: Against Medical Advice | Payer: Self-pay | Attending: Emergency Medicine | Admitting: Emergency Medicine

## 2016-01-29 DIAGNOSIS — W311XXA Contact with metalworking machines, initial encounter: Secondary | ICD-10-CM | POA: Insufficient documentation

## 2016-01-29 DIAGNOSIS — T1500XA Foreign body in cornea, unspecified eye, initial encounter: Secondary | ICD-10-CM

## 2016-01-29 DIAGNOSIS — T1502XA Foreign body in cornea, left eye, initial encounter: Secondary | ICD-10-CM | POA: Insufficient documentation

## 2016-01-29 DIAGNOSIS — Y990 Civilian activity done for income or pay: Secondary | ICD-10-CM | POA: Insufficient documentation

## 2016-01-29 DIAGNOSIS — Z77018 Contact with and (suspected) exposure to other hazardous metals: Secondary | ICD-10-CM | POA: Insufficient documentation

## 2016-01-29 DIAGNOSIS — Y9269 Other specified industrial and construction area as the place of occurrence of the external cause: Secondary | ICD-10-CM | POA: Insufficient documentation

## 2016-01-29 DIAGNOSIS — Y9389 Activity, other specified: Secondary | ICD-10-CM | POA: Insufficient documentation

## 2016-01-29 DIAGNOSIS — F17210 Nicotine dependence, cigarettes, uncomplicated: Secondary | ICD-10-CM | POA: Insufficient documentation

## 2016-01-29 MED ORDER — KETOROLAC TROMETHAMINE 0.5 % OP SOLN
1.0000 [drp] | Freq: Once | OPHTHALMIC | Status: DC
  Filled 2016-01-29: qty 5

## 2016-01-29 MED ORDER — FLUORESCEIN SODIUM 1 MG OP STRP
ORAL_STRIP | OPHTHALMIC | Status: AC
  Filled 2016-01-29: qty 1

## 2016-01-29 MED ORDER — ACETAMINOPHEN 500 MG PO TABS
1000.0000 mg | ORAL_TABLET | Freq: Once | ORAL | Status: AC
  Administered 2016-01-29: 1000 mg via ORAL
  Filled 2016-01-29: qty 2

## 2016-01-29 MED ORDER — FLUORESCEIN SODIUM 1 MG OP STRP
1.0000 | ORAL_STRIP | Freq: Once | OPHTHALMIC | Status: AC
  Administered 2016-01-29: 1 via OPHTHALMIC
  Filled 2016-01-29: qty 1

## 2016-01-29 MED ORDER — TETRACAINE HCL 0.5 % OP SOLN
1.0000 [drp] | Freq: Once | OPHTHALMIC | Status: AC
  Administered 2016-01-29: 1 [drp] via OPHTHALMIC
  Filled 2016-01-29: qty 4

## 2016-01-29 NOTE — ED Notes (Signed)
Pt left AMA at this time.  

## 2016-01-29 NOTE — ED Notes (Signed)
Pt states he was at work and drill shavings got in both his eyes; pt states it hurts to open both eyes

## 2016-01-29 NOTE — ED Provider Notes (Signed)
CSN: [id number]     Arrival date & time 01/29/16  0109 History   First MD Initiated Contact with Patient 01/29/16 0212 AM    Chief Complaint  Patient presents with  . 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 He states he was wearing eye shields. He states he started having pain in his eyes about 3 hours ago. He states both eyes are painful. Patient states his last tetanus is less than 10 years ago.  PCP none  Past Medical History  Diagnosis Date  . Kidney stone   . Status post alcohol detoxification    Past Surgical History  Procedure Laterality Date  . Pleural scarification     History reviewed. No pertinent family history. Social History  Substance Use Topics  . Smoking status: Current Every Day Smoker -- 0.50 packs/day    Types: Cigarettes  . Smokeless tobacco: None  . Alcohol Use: No     Comment: former-last use x 3 months  employed  Review of Systems  All other systems reviewed and are negative.     Allergies  Sulfa antibiotics  Home Medications   Prior to Admission medications   Medication Sig Start Date End Date Taking? Authorizing Provider  ibuprofen (ADVIL,MOTRIN) 600 MG tablet Take 1 tablet (600 mg total) by mouth every 6 (six) hours as needed. 11/29/15   Burgess AmorJulie Idol, PA-C  methocarbamol (ROBAXIN) 500 MG tablet Take 1 tablet (500 mg total) by mouth every 8 (eight) hours as needed for muscle spasms. 09/12/15   Loren Raceravid Yelverton, MD  oxymetazoline (AFRIN) 0.05 % nasal spray Place 1 spray into both nostrils 2 (two) times daily.    Historical Provider, MD  phenylephrine (NEO-SYNEPHRINE) 0.5 % nasal solution Place 1 drop into both nostrils every 6 (six) hours as needed for congestion.    Historical Provider, MD  traMADol (ULTRAM) 50 MG tablet Take 1 tablet (50 mg total) by mouth every 6 (six)  hours as needed. 11/29/15   Burgess AmorJulie Idol, PA-C   BP 158/108 mmHg  Pulse 72  Temp(Src) 97.8 F (36.6 C) (Oral)  Resp 20  Ht 6' (1.829 m)  Wt 170 lb (77.111 kg)  BMI 23.05 kg/m2  SpO2 98% Physical Exam  Constitutional: He is oriented to person, place, and time. He appears well-developed and well-nourished.  Non-toxic appearance. He does not appear ill. He appears distressed.  HENT:  Head: Normocephalic and atraumatic.  Right Ear: External ear normal.  Left Ear: External ear normal.  Nose: Nose normal. No mucosal edema or rhinorrhea.  Mouth/Throat: Oropharynx is clear and moist and mucous membranes are normal. No dental abscesses or uvula swelling.  Eyes: Conjunctivae and EOM are normal. Pupils are equal, round, and reactive to light.  On gross visualization I do not see anything obvious on his right cornea, he has diffuse conjunctival injection. On his left cornea he does appear to have some scattered foreign bodies on the cornea. He has scleral injection.  Neck: Normal range of motion and full passive range of motion without pain.  Pulmonary/Chest: No respiratory distress. He has no rhonchi. He exhibits no crepitus.  Abdominal: Normal appearance.  Musculoskeletal: Normal range of motion.  Moves all extremities well.   Neurological: He is alert and oriented to person, place, and time. He has normal strength. No cranial nerve deficit.  Skin: Skin is warm, dry and intact.  No rash noted. No erythema. No pallor.  Psychiatric: He has a normal mood and affect. His speech is normal and behavior is normal. His mood appears not anxious.  Nursing note and vitals reviewed.   ED Course  Procedures (including critical care time)  Medications  ketorolac (ACULAR) 0.5 % ophthalmic solution 1 drop (not administered)  tetracaine (PONTOCAINE) 0.5 % ophthalmic solution 1 drop (1 drop Both Eyes Given 01/29/16 0226)  fluorescein ophthalmic strip 1 strip (1 strip Both Eyes Given 01/29/16 0226)  acetaminophen  (TYLENOL) tablet 1,000 mg (1,000 mg Oral Given 01/29/16 0343)   Patient had fluorescent stain placed in his eyes. There is no uptake on either side however he has no depth multiple small metallic foreign bodies on his left cornea. I count about 10. There are none on the right.Patient is requesting Tylenol for headache.  I had nurses irrigate his eye to see if that would dislodge some of the multiple foreign bodies.   Due to patient volume and the need to spend a lot of time with this patient trying to remove all these metal foreign bodies patient had to wait and evidently signed out AMA just prior to me being able to go back and take care of his foreign bodies.   Please note visual acuities were ordered and its documented that they were done however it is not documented with the visual acuities were.   MDM   Final diagnoses:  Corneal foreign body, unspecified laterality, initial encounter    Pt left AMA  Devoria Albe, MD, Concha Pyo, MD 01/29/16 [phone]

## 2016-07-18 ENCOUNTER — Emergency Department (HOSPITAL_COMMUNITY)
Admission: EM | Admit: 2016-07-18 | Discharge: 2016-07-18 | Disposition: A | Discharge status: Home / Self Care | Payer: Self-pay | Attending: Emergency Medicine | Admitting: Emergency Medicine

## 2016-07-18 ENCOUNTER — Encounter (HOSPITAL_COMMUNITY): Payer: Self-pay | Admitting: Emergency Medicine

## 2016-07-18 DIAGNOSIS — J40 Bronchitis, not specified as acute or chronic: Secondary | ICD-10-CM | POA: Insufficient documentation

## 2016-07-18 DIAGNOSIS — F17210 Nicotine dependence, cigarettes, uncomplicated: Secondary | ICD-10-CM | POA: Insufficient documentation

## 2016-07-18 MED ORDER — PREDNISONE 20 MG PO TABS: 40.0000 mg | ORAL_TABLET | Freq: Every day | ORAL | 0 refills | Status: DC

## 2016-07-18 MED ORDER — DEXAMETHASONE SODIUM PHOSPHATE 4 MG/ML IJ SOLN
10.0000 mg | Freq: Once | INTRAMUSCULAR | Status: AC
  Administered 2016-07-18: 10 mg via INTRAMUSCULAR
  Filled 2016-07-18: qty 3

## 2016-07-18 MED ORDER — ALBUTEROL SULFATE HFA 108 (90 BASE) MCG/ACT IN AERS
2.0000 | INHALATION_SPRAY | Freq: Once | RESPIRATORY_TRACT | Status: AC
  Administered 2016-07-18: 2 via RESPIRATORY_TRACT
  Filled 2016-07-18: qty 6.7

## 2016-07-18 NOTE — ED Provider Notes (Signed)
[facility]-EMERGENCY DEPT Provider Note   CSN: [id number] Arrival date & time: 07/18/16  1426     History   Chief Complaint Chief Complaint  Patient presents with  . Cough    HPI Devin Hodge is a 45 y.o. male.  HPI   Devin Hodge is a 45 y.o. male who presents to the Emergency Department complaining of cough, sore throat, and nasal congestion. Symptoms have been present for 4 days. Cough is productive of yellow sputum. Symptoms are worse at night. He has not taken any medications for symptom relief. He also complains of upper chest tightness that associated with cough. He denies known fever, shortness of breath, hemoptysis or posttussive emesis.   Past Medical History:  Diagnosis Date  . Kidney stone   . Status post alcohol detoxification     Patient Active Problem List   Diagnosis Date Noted  . Cocaine abuse 02/23/2013  . Narcotic abuse 02/23/2013  . Alcohol dependence (HCC) 05/26/2012  . Opioid dependence (HCC) 05/26/2012    Past Surgical History:  Procedure Laterality Date  . PLEURAL SCARIFICATION         Home Medications    Prior to Admission medications   Medication Sig Start Date End Date Taking? Authorizing Provider  ibuprofen (ADVIL,MOTRIN) 600 MG tablet Take 1 tablet (600 mg total) by mouth every 6 (six) hours as needed. 11/29/15   Burgess AmorJulie Idol, PA-C  methocarbamol (ROBAXIN) 500 MG tablet Take 1 tablet (500 mg total) by mouth every 8 (eight) hours as needed for muscle spasms. 09/12/15   Loren Raceravid Yelverton, MD  oxymetazoline (AFRIN) 0.05 % nasal spray Place 1 spray into both nostrils 2 (two) times daily.    Historical Provider, MD  phenylephrine (NEO-SYNEPHRINE) 0.5 % nasal solution Place 1 drop into both nostrils every 6 (six) hours as needed for congestion.    Historical Provider, MD  traMADol (ULTRAM) 50 MG tablet Take 1 tablet (50 mg total) by mouth every 6 (six) hours as needed. 11/29/15   Burgess AmorJulie Idol, PA-C    Family History History reviewed. No  pertinent family history.  Social History Social History  Substance Use Topics  . Smoking status: Current Every Day Smoker    Packs/day: 0.50    Types: Cigarettes  . Smokeless tobacco: Not on file  . Alcohol use No     Comment: former-last use x 3 months     Allergies   Sulfa antibiotics   Review of Systems Review of Systems  Constitutional: Negative for appetite change, chills and fever.  HENT: Positive for congestion and sore throat. Negative for trouble swallowing.   Respiratory: Positive for cough and chest tightness. Negative for shortness of breath and wheezing.   Cardiovascular: Negative for chest pain.  Gastrointestinal: Negative for abdominal pain, nausea and vomiting.  Genitourinary: Negative for dysuria.  Musculoskeletal: Positive for myalgias. Negative for arthralgias.  Skin: Negative for rash.  Neurological: Negative for dizziness, weakness and numbness.  Hematological: Negative for adenopathy.  All other systems reviewed and are negative.    Physical Exam Updated Vital Signs BP 126/86   Pulse 76   Temp 98 F (36.7 C) (Oral)   Resp 20   Ht 6' (1.829 m)   Wt 82.6 kg   SpO2 99%   BMI 24.68 kg/m   Physical Exam  Constitutional: He is oriented to person, place, and time. He appears well-developed and well-nourished. No distress.  HENT:  Head: Normocephalic and atraumatic.  Right Ear: Tympanic membrane and ear canal normal.  Left Ear: Tympanic membrane and ear canal normal.  Mouth/Throat: Uvula is midline, oropharynx is clear and moist and mucous membranes are normal. No oropharyngeal exudate.  Eyes: EOM are normal. Pupils are equal, round, and reactive to light.  Neck: Normal range of motion, full passive range of motion without pain and phonation normal. Neck supple.  Cardiovascular: Normal rate, regular rhythm and intact distal pulses.   No murmur heard. Pulmonary/Chest: Effort normal. No stridor. No respiratory distress. He has no rales. He  exhibits no tenderness.  Coarse lungs sounds bilaterally. No rales  Musculoskeletal: He exhibits no edema.  Lymphadenopathy:    He has no cervical adenopathy.  Neurological: He is alert and oriented to person, place, and time. He exhibits normal muscle tone. Coordination normal.  Skin: Skin is warm and dry.  Nursing note and vitals reviewed.    ED Treatments / Results  Labs (all labs ordered are listed, but only abnormal results are displayed) Labs Reviewed - No data to display  EKG  EKG Interpretation None       Radiology No results found.  Procedures Procedures (including critical care time)  Medications Ordered in ED Medications  albuterol (PROVENTIL HFA;VENTOLIN HFA) 108 (90 Base) MCG/ACT inhaler 2 puff (not administered)  dexamethasone (DECADRON) injection 10 mg (not administered)     Initial Impression / Assessment and Plan / ED Course  I have reviewed the triage vital signs and the nursing notes.  Pertinent labs & imaging results that were available during my care of the patient were reviewed by me and considered in my medical decision making (see chart for details).  Clinical Course    Patient will appearing. Vital signs are stable. No hypoxia, tachycardia or tachypnea. Symptoms are likely related to bronchitis. Patient appears stable for discharge and agrees to PMD follow-up if needed.  IM Decadron given here and albuterol inhaler dispensed  Final Clinical Impressions(s) / ED Diagnoses   Final diagnoses:  Bronchitis    New Prescriptions New Prescriptions   No medications on file     Pauline Aus, PA-C 07/18/16 1457    Bethann Berkshire, MD 07/18/16 [phone]

## 2016-07-18 NOTE — Discharge Instructions (Signed)
2 puffs of the inhaler 4 times a day as needed. You can also take over-the-counter Sudafed if needed for congestion. Return to the ER for any worsening symptoms.

## 2016-07-18 NOTE — ED Triage Notes (Signed)
Reports productive cough with yellow sputum x 4 days.  Slight sore throat and congestion.

## 2016-10-14 ENCOUNTER — Ambulatory Visit (HOSPITAL_COMMUNITY)
Admission: RE | Admit: 2016-10-14 | Discharge: 2016-10-14 | Disposition: A | Discharge status: Home / Self Care | Payer: Self-pay | Attending: Psychiatry | Admitting: Psychiatry

## 2016-10-14 ENCOUNTER — Emergency Department (HOSPITAL_COMMUNITY)
Admission: EM | Admit: 2016-10-14 | Discharge: 2016-10-15 | Disposition: A | Discharge status: Home / Self Care | Payer: Self-pay | Attending: Emergency Medicine | Admitting: Emergency Medicine

## 2016-10-14 ENCOUNTER — Encounter (HOSPITAL_COMMUNITY): Payer: Self-pay | Admitting: *Deleted

## 2016-10-14 DIAGNOSIS — S5012XA Contusion of left forearm, initial encounter: Secondary | ICD-10-CM | POA: Insufficient documentation

## 2016-10-14 DIAGNOSIS — Y939 Activity, unspecified: Secondary | ICD-10-CM | POA: Insufficient documentation

## 2016-10-14 DIAGNOSIS — R45851 Suicidal ideations: Secondary | ICD-10-CM

## 2016-10-14 DIAGNOSIS — Y999 Unspecified external cause status: Secondary | ICD-10-CM | POA: Insufficient documentation

## 2016-10-14 DIAGNOSIS — F17210 Nicotine dependence, cigarettes, uncomplicated: Secondary | ICD-10-CM | POA: Insufficient documentation

## 2016-10-14 DIAGNOSIS — F1414 Cocaine abuse with cocaine-induced mood disorder: Secondary | ICD-10-CM | POA: Insufficient documentation

## 2016-10-14 DIAGNOSIS — X58XXXA Exposure to other specified factors, initial encounter: Secondary | ICD-10-CM | POA: Insufficient documentation

## 2016-10-14 DIAGNOSIS — Z79899 Other long term (current) drug therapy: Secondary | ICD-10-CM | POA: Insufficient documentation

## 2016-10-14 DIAGNOSIS — Y929 Unspecified place or not applicable: Secondary | ICD-10-CM | POA: Insufficient documentation

## 2016-10-14 HISTORY — DX: Other psychoactive substance dependence, in remission: F19.21

## 2016-10-14 HISTORY — DX: Major depressive disorder, single episode, unspecified: F32.9

## 2016-10-14 HISTORY — DX: Depression, unspecified: F32.A

## 2016-10-14 HISTORY — DX: Post-traumatic stress disorder, unspecified: F43.10

## 2016-10-14 HISTORY — DX: Anxiety disorder, unspecified: F41.9

## 2016-10-14 LAB — RAPID URINE DRUG SCREEN, HOSP PERFORMED
Amphetamines: NOT DETECTED
BARBITURATES: NOT DETECTED
Benzodiazepines: NOT DETECTED
COCAINE: POSITIVE — AB
Opiates: NOT DETECTED
TETRAHYDROCANNABINOL: POSITIVE — AB
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
## 2016-10-14 NOTE — ED Triage Notes (Signed)
Pt stated "I've been having depression and suicidal thoughts.  I would take a bunch of pills.  My girlfriend brought me up here.  I haven't taken meds in over a year because I didn't think they were doing me any good.  I have PTSD, depression & anxiety.  [facility].  It was a couple of months ago.  I took my medicine while I was there but when I got out, I didn't."

## 2016-10-14 NOTE — H&P (Signed)
Behavioral Health Medical Screening Exam  Devin Hodge is an 46 y.o. male presents with c/o of depression symptoms with SI x 2 - 3 days duration. [facility]. He fives hx of polysubstance abuse I.e. Opiates, suboxone and cocaine use. He is denying known significant pmhx, CP, Ataxia, N/V, Vertigo  Total Time spent with patient: 20 minutes  Psychiatric Specialty Exam: Physical Exam  Constitutional: He is oriented to person, place, and time. No distress.  HENT:  Head: Normocephalic.  Eyes: Pupils are equal, round, and reactive to light.  Neurological: He is alert and oriented to person, place, and time. No cranial nerve deficit.  Skin: Skin is warm and dry. He is not diaphoretic.  Psychiatric: His mood appears anxious. His speech is slurred. He is withdrawn. Cognition and memory are impaired. He expresses inappropriate judgment. He exhibits a depressed mood. He expresses suicidal ideation.    Review of Systems  Psychiatric/Behavioral: Positive for depression, substance abuse and suicidal ideas. The patient is nervous/anxious and has insomnia.   All other systems reviewed and are negative.   There were no vitals taken for this visit.There is no height or weight on file to calculate BMI.  General Appearance: Disheveled  Eye Contact:  Good  Speech:  Clear and Coherent  Volume:  Normal  Mood:  Depressed  Affect:  Congruent  Thought Process:  Goal Directed  Orientation:  Full (Time, Place, and Person)  Thought Content:  Negative  Suicidal Thoughts:  Yes.  without intent/plan  Homicidal Thoughts:  No  Memory:  Immediate;   Fair  Judgement:  Impaired  Insight:  Lacking  Psychomotor Activity:  Negative  Concentration: Concentration: Fair  Recall:  FiservFair  Fund of Knowledge:Fair  Language: Fair  Akathisia:  Negative  Handed:  Right  AIMS (if indicated):     Assets:  Desire for Improvement  Sleep:       Musculoskeletal: Strength & Muscle  Tone: within normal limits Gait & Station: normal Patient leans: N/A  There were no vitals taken for this visit.  Recommendations:  Based on my evaluation the patient appears to have an emergency medical condition for which I recommend the patient be transferred to the emergency department for further evaluation.  Alton Tremblay E, PA-C 10/14/2016, 10:02 PM
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
## 2016-10-15 DIAGNOSIS — F1414 Cocaine abuse with cocaine-induced mood disorder: Secondary | ICD-10-CM | POA: Diagnosis present

## 2016-10-15 DIAGNOSIS — Z79899 Other long term (current) drug therapy: Secondary | ICD-10-CM

## 2016-10-15 DIAGNOSIS — F17210 Nicotine dependence, cigarettes, uncomplicated: Secondary | ICD-10-CM

## 2016-10-15 DIAGNOSIS — Z882 Allergy status to sulfonamides status: Secondary | ICD-10-CM

## 2016-10-15 DIAGNOSIS — Z9889 Other specified postprocedural states: Secondary | ICD-10-CM

## 2016-10-15 LAB — SALICYLATE LEVEL

## 2016-10-15 LAB — CBC
HEMATOCRIT: 38.5 % — AB (ref 39.0–52.0)
HEMOGLOBIN: 12.9 g/dL — AB (ref 13.0–17.0)
MCH: 29.2 pg (ref 26.0–34.0)
MCHC: 33.5 g/dL (ref 30.0–36.0)
MCV: 87.1 fL (ref 78.0–100.0)
Platelets: 189 10*3/uL (ref 150–400)
RBC: 4.42 MIL/uL (ref 4.22–5.81)
RDW: 14.5 % (ref 11.5–15.5)
WBC: 8 10*3/uL (ref 4.0–10.5)

## 2016-10-15 LAB — COMPREHENSIVE METABOLIC PANEL
ALBUMIN: 3.8 g/dL (ref 3.5–5.0)
ALK PHOS: 62 U/L (ref 38–126)
ALT: 38 U/L (ref 17–63)
AST: 29 U/L (ref 15–41)
Anion gap: 8 (ref 5–15)
BUN: 12 mg/dL (ref 6–20)
CALCIUM: 9 mg/dL (ref 8.9–10.3)
CO2: 27 mmol/L (ref 22–32)
CREATININE: 1.01 mg/dL (ref 0.61–1.24)
Chloride: 106 mmol/L (ref 101–111)
GFR calc Af Amer: >60 mL/min (ref >60)
GFR calc non Af Amer: >60 mL/min (ref >60)
GLUCOSE: 104 mg/dL — AB (ref 65–99)
Potassium: 3.8 mmol/L (ref 3.5–5.1)
Sodium: 141 mmol/L (ref 135–145)
Total Bilirubin: 0.3 mg/dL (ref 0.3–1.2)
Total Protein: 7 g/dL (ref 6.5–8.1)

## 2016-10-15 LAB — ETHANOL: Alcohol, Ethyl (B): <5 mg/dL (ref <5)

## 2016-10-15 LAB — ACETAMINOPHEN LEVEL: Acetaminophen (Tylenol), Serum: <10 ug/mL — ABNORMAL LOW (ref 10–30)

## 2016-10-15 MED ORDER — ONDANSETRON HCL 4 MG PO TABS: 4.0000 mg | ORAL_TABLET | Freq: Three times a day (TID) | ORAL | Status: DC | PRN

## 2016-10-15 MED ORDER — LORAZEPAM 1 MG PO TABS: 1.0000 mg | ORAL_TABLET | Freq: Three times a day (TID) | ORAL | Status: DC | PRN

## 2016-10-15 MED ORDER — NAPROXEN 500 MG PO TABS
500.0000 mg | ORAL_TABLET | Freq: Two times a day (BID) | ORAL | Status: DC
  Administered 2016-10-15 (×2): 500 mg via ORAL
  Filled 2016-10-15 (×2): qty 1

## 2016-10-15 MED ORDER — ZOLPIDEM TARTRATE 5 MG PO TABS: 5.0000 mg | ORAL_TABLET | Freq: Every evening | ORAL | Status: DC | PRN

## 2016-10-15 MED ORDER — IBUPROFEN 200 MG PO TABS: 600.0000 mg | ORAL_TABLET | Freq: Three times a day (TID) | ORAL | Status: DC | PRN

## 2016-10-15 NOTE — BH Assessment (Signed)
[facility] Assessment Progress Note  Per Thedore MinsMojeed Akintayo, MD, this pt does not require psychiatric hospitalization at this time.  [facility] with referral information for area substance abuse treatment providers.  Discharge instructions include referral information for [facility], [facility], and [facility].  Pt's nurse has been notified.  Doylene Canninghomas Awa Bachicha, MA Triage Specialist [phone]

## 2016-10-15 NOTE — Discharge Instructions (Signed)
To help you maintain a sober lifestyle, a substance abuse treatment program may be beneficial to you.  Contact one of the following facilities at your earliest opportunity to ask about enrolling: ° °RESIDENTIAL PROGRAMS: ° °     [facility] °     [address] °     [place], [place] [address] °     [phone] ° °     [facility] °     [address] °     [place], [place] [address] °     [phone] ° °OUTPATIENT PROGRAMS: ° °     [facility] [place] °     [address] °     [place], [place] [address] °     [phone] °

## 2016-10-15 NOTE — Consult Note (Addendum)
[facility] Face-to-Face Psychiatry Consult   Reason for Consult:  Cocaine abuse with suicidal ideations Referring Physician:  EDP Patient Identification: Devin Hodge MRN:  [id number] Principal Diagnosis: Cocaine abuse with cocaine-induced mood disorder [place]) Diagnosis:   Patient Active Problem List   Diagnosis Date Noted  . Cocaine abuse with cocaine-induced mood disorder [facility]) [F14.14] 10/15/2016    Priority: High  . Cocaine abuse [F14.10] 02/23/2013  . Narcotic abuse [F11.10] 02/23/2013  . Alcohol dependence ([place]) [F10.20] 05/26/2012  . Opioid dependence ([place]) [F11.20] 05/26/2012    Total Time spent with patient: 45 minutes  Subjective:   Devin Hodge is a 46 y.o. male patient denies suicidal/homicidal ideations, past suicide attempts, hallucinations, and withdrawal symptoms.  HPI:  46 yo male who presented to the ED with cocaine abuse and suicidal ideations.  Today, he denies suicidal/homicidal ideations, past suicide attempts, hallucinations, and withdrawal symptoms.  Recently at [facility] for substance abuse and has been to multiple rehabs.  He denied legal issues but a court date is scheduled for 2/20 for driving without a license and speeding.  Minimizes his cocaine use and admits to buying Suboxone and Xanax off the street for use.  Rehab referrals provided.  Past Psychiatric History: substance abuse  Risk to Self: Is patient at risk for suicide?: denies Risk to Others:   Prior Inpatient Therapy:   Prior Outpatient Therapy:    Past Medical History:  Past Medical History:  Diagnosis Date  . Anxiety   . Depression   . Drug addiction in remission ([place])   . Kidney stone   . PTSD (post-traumatic stress disorder)   . Status post alcohol detoxification     Past Surgical History:  Procedure Laterality Date  . PLEURAL SCARIFICATION     Family History: No family history on file. Family Psychiatric  History: substance abuse Social History:  History  Alcohol Use No     Comment: former-last use x 3 months     History  Drug Use No    Social History   Social History  . Marital status: Divorced    Spouse name: N/A  . Number of children: N/A  . Years of education: N/A   Social History Main Topics  . Smoking status: Current Every Day Smoker    Packs/day: 0.50    Types: Cigarettes  . Smokeless tobacco: Former Systems developer  . Alcohol use No     Comment: former-last use x 3 months  . Drug use: No  . Sexual activity: Yes    Birth control/ protection: None   Other Topics Concern  . None   Social History Narrative  . None   Additional Social History:    Allergies:   Allergies  Allergen Reactions  . Sulfa Antibiotics Hives    Labs:  Results for orders placed or performed during the hospital encounter of 10/14/16 (from the past 48 hour(s))  Rapid urine drug screen (hospital performed)     Status: Abnormal   Collection Time: 10/14/16 11:24 PM  Result Value Ref Range   Opiates NONE DETECTED NONE DETECTED   Cocaine POSITIVE (A) NONE DETECTED   Benzodiazepines NONE DETECTED NONE DETECTED   Amphetamines NONE DETECTED NONE DETECTED   Tetrahydrocannabinol POSITIVE (A) NONE DETECTED   Barbiturates NONE DETECTED NONE DETECTED    Comment:        DRUG SCREEN FOR MEDICAL PURPOSES ONLY.  IF CONFIRMATION IS NEEDED FOR ANY PURPOSE, NOTIFY LAB WITHIN 5 DAYS.  LOWEST DETECTABLE LIMITS FOR URINE DRUG SCREEN Drug Class       Cutoff (ng/mL) Amphetamine      1000 Barbiturate      200 Benzodiazepine   638 Tricyclics       466 Opiates          300 Cocaine          300 THC              50   Comprehensive metabolic panel     Status: Abnormal   Collection Time: 10/14/16 11:35 PM  Result Value Ref Range   Sodium 141 135 - 145 mmol/L   Potassium 3.8 3.5 - 5.1 mmol/L   Chloride 106 101 - 111 mmol/L   CO2 27 22 - 32 mmol/L   Glucose, Bld 104 (H) 65 - 99 mg/dL   BUN 12 6 - 20 mg/dL   Creatinine, Ser 1.01 0.61 - 1.24 mg/dL   Calcium 9.0 8.9 - 10.3  mg/dL   Total Protein 7.0 6.5 - 8.1 g/dL   Albumin 3.8 3.5 - 5.0 g/dL   AST 29 15 - 41 U/L   ALT 38 17 - 63 U/L   Alkaline Phosphatase 62 38 - 126 U/L   Total Bilirubin 0.3 0.3 - 1.2 mg/dL   GFR calc non Af Amer >60 >60 mL/min   GFR calc Af Amer >60 >60 mL/min    Comment: (NOTE) The eGFR has been calculated using the CKD EPI equation. This calculation has not been validated in all clinical situations. eGFR's persistently <60 mL/min signify possible Chronic Kidney Disease.    Anion gap 8 5 - 15  Ethanol     Status: None   Collection Time: 10/14/16 11:35 PM  Result Value Ref Range   Alcohol, Ethyl (B) <5 <5 mg/dL    Comment:        LOWEST DETECTABLE LIMIT FOR SERUM ALCOHOL IS 5 mg/dL FOR MEDICAL PURPOSES ONLY   Salicylate level     Status: None   Collection Time: 10/14/16 11:35 PM  Result Value Ref Range   Salicylate Lvl <5.9 2.8 - 30.0 mg/dL  Acetaminophen level     Status: Abnormal   Collection Time: 10/14/16 11:35 PM  Result Value Ref Range   Acetaminophen (Tylenol), Serum <10 (L) 10 - 30 ug/mL    Comment:        THERAPEUTIC CONCENTRATIONS VARY SIGNIFICANTLY. A RANGE OF 10-30 ug/mL MAY BE AN EFFECTIVE CONCENTRATION FOR MANY PATIENTS. HOWEVER, SOME ARE BEST TREATED AT CONCENTRATIONS OUTSIDE THIS RANGE. ACETAMINOPHEN CONCENTRATIONS >150 ug/mL AT 4 HOURS AFTER INGESTION AND >50 ug/mL AT 12 HOURS AFTER INGESTION ARE OFTEN ASSOCIATED WITH TOXIC REACTIONS.   cbc     Status: Abnormal   Collection Time: 10/14/16 11:35 PM  Result Value Ref Range   WBC 8.0 4.0 - 10.5 K/uL   RBC 4.42 4.22 - 5.81 MIL/uL   Hemoglobin 12.9 (L) 13.0 - 17.0 g/dL   HCT 38.5 (L) 39.0 - 52.0 %   MCV 87.1 78.0 - 100.0 fL   MCH 29.2 26.0 - 34.0 pg   MCHC 33.5 30.0 - 36.0 g/dL   RDW 14.5 11.5 - 15.5 %   Platelets 189 150 - 400 K/uL    Current Facility-Administered Medications  Medication Dose Route Frequency Provider Last Rate Last Dose  . ibuprofen (ADVIL,MOTRIN) tablet 600 mg  600 mg Oral  Q8H PRN Ankit Nanavati, MD      . naproxen (NAPROSYN) tablet 500 mg  500 mg Oral BID Varney Biles, MD   500 mg at 10/15/16 0920  . ondansetron (ZOFRAN) tablet 4 mg  4 mg Oral Q8H PRN Varney Biles, MD       Current Outpatient Prescriptions  Medication Sig Dispense Refill  . ibuprofen (ADVIL,MOTRIN) 600 MG tablet Take 1 tablet (600 mg total) by mouth every 6 (six) hours as needed. (Patient not taking: Reported on 07/18/2016) 30 tablet 0  . methocarbamol (ROBAXIN) 500 MG tablet Take 1 tablet (500 mg total) by mouth every 8 (eight) hours as needed for muscle spasms. (Patient not taking: Reported on 07/18/2016) 30 tablet 0  . predniSONE (DELTASONE) 20 MG tablet Take 2 tablets (40 mg total) by mouth daily. For 5 days (Patient not taking: Reported on 10/14/2016) 10 tablet 0  . traMADol (ULTRAM) 50 MG tablet Take 1 tablet (50 mg total) by mouth every 6 (six) hours as needed. (Patient not taking: Reported on 07/18/2016) 20 tablet 0    Musculoskeletal: Strength & Muscle Tone: within normal limits Gait & Station: normal Patient leans: N/A  Psychiatric Specialty Exam: Physical Exam  Constitutional: He is oriented to person, place, and time. He appears well-developed and well-nourished.  HENT:  Head: Normocephalic.  Neck: Normal range of motion.  Respiratory: Effort normal.  Musculoskeletal: Normal range of motion.  Neurological: He is alert and oriented to person, place, and time.  Psychiatric: His speech is normal and behavior is normal. Judgment and thought content normal. Cognition and memory are normal. He exhibits a depressed mood.    Review of Systems  Psychiatric/Behavioral: Positive for depression and substance abuse.  All other systems reviewed and are negative.   Blood pressure 122/82, pulse 64, temperature 98.2 F (36.8 C), resp. rate 18, height _0  (1.854 m), weight 77.1 kg (170 lb), SpO2 98 %.Body mass index is 22.43 kg/m.  General Appearance: Casual  Eye Contact:  Good   Speech:  Normal Rate  Volume:  Normal  Mood:  Depressed, mild  Affect:  Appropriate and Congruent  Thought Process:  Coherent and Descriptions of Associations: Intact  Orientation:  Full (Time, Place, and Person)  Thought Content:  WDL  Suicidal Thoughts:  No  Homicidal Thoughts:  No  Memory:  Immediate;   Good Recent;   Good Remote;   Good  Judgement:  Fair  Insight:  Fair  Psychomotor Activity:  Normal  Concentration:  Concentration: Good and Attention Span: Good  Recall:  Good  Fund of Knowledge:  Fair  Language:  Good  Akathisia:  No  Handed:  Right  AIMS (if indicated):     Assets:  Housing Intimacy Leisure Time Physical Health Resilience Social Support  ADL's:  Intact  Cognition:  WNL  Sleep:        Treatment Plan Summary: Daily contact with patient to assess and evaluate symptoms and progress in treatment, Medication management and Plan Cocaine abuse with cociane induced mood disorder:  -Crisis stabilization -Medication management:  Medications not started due to cocaine needing to clear his system -Individual and substance abuse counseling  -Referral to [facility], [facility], [facility]  Disposition: No evidence of imminent risk to self or others at present.    Waylan Boga, NP 10/15/2016 12:41 PM  Patient seen face-to-face for psychiatric evaluation, chart reviewed and case discussed with the physician extender and developed treatment plan. Reviewed the information documented and agree with the treatment plan. Corena Pilgrim, MD

## 2016-10-15 NOTE — ED Notes (Signed)
Pt requesting something for pain.  Informed have spoken with Dr. Rhunette CroftNanavati & he is ordering.  Pt verbalized understanding.

## 2016-10-15 NOTE — ED Provider Notes (Addendum)
[facility]-EMERGENCY DEPT Provider Note   CSN: [id number] Arrival date & time: 10/14/16  2140     History   Chief Complaint Chief Complaint  Patient presents with  . Suicidal  . Arm Injury    left    HPI Devin Hodge is a 46 y.o. male.  HPI Pt comes in with cc of depression and SI. Reports that he has been non compliant with his meds. Pt has always had depression and ongoing suicidal thoughts, but his symptoms have gotten worse. Pt has thought about OD. He has access to firearms. Pt has been buying suboxone from the streets to help stay clean from opioids and denies any active drug or alcohol use. Pt denies nausea, emesis, fevers, chills, chest pains, shortness of breath, headaches, abdominal pain, uti like symptoms.   Past Medical History:  Diagnosis Date  . Anxiety   . Depression   . Drug addiction in remission (HCC)   . Kidney stone   . PTSD (post-traumatic stress disorder)   . Status post alcohol detoxification     Patient Active Problem List   Diagnosis Date Noted  . Cocaine abuse 02/23/2013  . Narcotic abuse 02/23/2013  . Alcohol dependence (HCC) 05/26/2012  . Opioid dependence (HCC) 05/26/2012    Past Surgical History:  Procedure Laterality Date  . PLEURAL SCARIFICATION         Home Medications    Prior to Admission medications   Medication Sig Start Date End Date Taking? Authorizing Provider  ibuprofen (ADVIL,MOTRIN) 600 MG tablet Take 1 tablet (600 mg total) by mouth every 6 (six) hours as needed. Patient not taking: Reported on 07/18/2016 11/29/15   Burgess Amor, PA-C  methocarbamol (ROBAXIN) 500 MG tablet Take 1 tablet (500 mg total) by mouth every 8 (eight) hours as needed for muscle spasms. Patient not taking: Reported on 07/18/2016 09/12/15   Loren Racer, MD  predniSONE (DELTASONE) 20 MG tablet Take 2 tablets (40 mg total) by mouth daily. For 5 days Patient not taking: Reported on 10/14/2016 07/18/16   Tammy Triplett, PA-C  traMADol (ULTRAM) 50  MG tablet Take 1 tablet (50 mg total) by mouth every 6 (six) hours as needed. Patient not taking: Reported on 07/18/2016 11/29/15   Burgess Amor, PA-C    Family History No family history on file.  Social History Social History  Substance Use Topics  . Smoking status: Current Every Day Smoker    Packs/day: 0.50    Types: Cigarettes  . Smokeless tobacco: Former Neurosurgeon  . Alcohol use No     Comment: former-last use x 3 months     Allergies   Sulfa antibiotics   Review of Systems Review of Systems  ROS 10 Systems reviewed and are negative for acute change except as noted in the HPI.     Physical Exam Updated Vital Signs BP 127/91 (BP Location: Right Arm)   Pulse 64   Temp 98.4 F (36.9 C) (Oral)   Resp 16   Ht 6\' 1"  (1.854 m)   Wt 170 lb (77.1 kg)   SpO2 99%   BMI 22.43 kg/m   Physical Exam  Constitutional: He is oriented to person, place, and time. He appears well-developed.  HENT:  Head: Normocephalic and atraumatic.  Eyes: Conjunctivae and EOM are normal. Pupils are equal, round, and reactive to light.  Neck: Normal range of motion. Neck supple.  Cardiovascular: Normal rate and regular rhythm.   Pulmonary/Chest: Effort normal and breath sounds normal.  Abdominal: Soft. Bowel  sounds are normal. He exhibits no distension. There is no tenderness. There is no rebound and no guarding.  Musculoskeletal: He exhibits edema and tenderness.  L elbow tenderness, ROM is intact.  Neurological: He is alert and oriented to person, place, and time.  Skin: Skin is warm.  Nursing note and vitals reviewed.    ED Treatments / Results  Labs (all labs ordered are listed, but only abnormal results are displayed) Labs Reviewed  COMPREHENSIVE METABOLIC PANEL - Abnormal; Notable for the following:       Result Value   Glucose, Bld 104 (*)    All other components within normal limits  ACETAMINOPHEN LEVEL - Abnormal; Notable for the following:    Acetaminophen (Tylenol), Serum <10  (*)    All other components within normal limits  CBC - Abnormal; Notable for the following:    Hemoglobin 12.9 (*)    HCT 38.5 (*)    All other components within normal limits  RAPID URINE DRUG SCREEN, HOSP PERFORMED - Abnormal; Notable for the following:    Cocaine POSITIVE (*)    Tetrahydrocannabinol POSITIVE (*)    All other components within normal limits  ETHANOL  SALICYLATE LEVEL    EKG  EKG Interpretation None       Radiology No results found.  Procedures Procedures (including critical care time)  Medications Ordered in ED Medications  naproxen (NAPROSYN) tablet 500 mg (500 mg Oral Given 10/15/16 0100)  zolpidem (AMBIEN) tablet 5 mg (not administered)  ibuprofen (ADVIL,MOTRIN) tablet 600 mg (not administered)  ondansetron (ZOFRAN) tablet 4 mg (not administered)  LORazepam (ATIVAN) tablet 1 mg (not administered)     Initial Impression / Assessment and Plan / ED Course  I have reviewed the triage vital signs and the nursing notes.  Pertinent labs & imaging results that were available during my care of the patient were reviewed by me and considered in my medical decision making (see chart for details).    Pt is having SI. Hx of depression, appears to be decompensated right now. No psychoses. Pt has substance abuse hx as well. Pt is medically cleared for psych eval.     Final Clinical Impressions(s) / ED Diagnoses   Final diagnoses:  Suicidal ideation  Contusion of left forearm, initial encounter    New Prescriptions New Prescriptions   No medications on file     Derwood KaplanAnkit Carmen Tolliver, MD 10/15/16 [id number]    Derwood KaplanAnkit Artur Winningham, MD 10/15/16 0101

## 2016-12-19 ENCOUNTER — Emergency Department (HOSPITAL_COMMUNITY)
Admission: EM | Admit: 2016-12-19 | Discharge: 2016-12-19 | Disposition: A | Discharge status: Home / Self Care | Payer: Self-pay | Attending: Emergency Medicine | Admitting: Emergency Medicine

## 2016-12-19 ENCOUNTER — Encounter (HOSPITAL_COMMUNITY): Payer: Self-pay

## 2016-12-19 DIAGNOSIS — F17210 Nicotine dependence, cigarettes, uncomplicated: Secondary | ICD-10-CM | POA: Insufficient documentation

## 2016-12-19 DIAGNOSIS — Z79899 Other long term (current) drug therapy: Secondary | ICD-10-CM | POA: Insufficient documentation

## 2016-12-19 DIAGNOSIS — Z791 Long term (current) use of non-steroidal anti-inflammatories (NSAID): Secondary | ICD-10-CM | POA: Insufficient documentation

## 2016-12-19 DIAGNOSIS — F1910 Other psychoactive substance abuse, uncomplicated: Secondary | ICD-10-CM | POA: Insufficient documentation

## 2016-12-19 LAB — URINALYSIS, ROUTINE W REFLEX MICROSCOPIC
Bilirubin Urine: NEGATIVE
GLUCOSE, UA: NEGATIVE mg/dL
Hgb urine dipstick: NEGATIVE
Ketones, ur: NEGATIVE mg/dL
LEUKOCYTES UA: NEGATIVE
NITRITE: NEGATIVE
PH: 6 (ref 5.0–8.0)
Protein, ur: NEGATIVE mg/dL
SPECIFIC GRAVITY, URINE: 1.01 (ref 1.005–1.030)

## 2016-12-19 LAB — RAPID URINE DRUG SCREEN, HOSP PERFORMED
Amphetamines: NOT DETECTED
BARBITURATES: NOT DETECTED
Benzodiazepines: NOT DETECTED
Cocaine: POSITIVE — AB
Opiates: NOT DETECTED
TETRAHYDROCANNABINOL: POSITIVE — AB

## 2016-12-19 NOTE — ED Triage Notes (Signed)
Pt is requesting help to get off of opiates, states he uses 150-200 mg of Oxycodone a day, last used today, states he is "just tired of it"  Pt denies SI/HI

## 2016-12-19 NOTE — ED Notes (Addendum)
Patient was discharged and presented with papers and outpatient resources for detox, patient became agitated stating" The doctor said I could say back here for several hours until my mother picks me up", then went on to say "I do not have anywhere to go",  I informed patient he could not stay back here, after being discharge he could wait in waiting room, I also checked with EDP [facility], he stated he told patient the same thing..  Patient became agitated that I would not allow him to say in treatment room after discharge and stormed out without signing or receiving discharge papers.

## 2016-12-19 NOTE — ED Provider Notes (Addendum)
[facility]-EMERGENCY DEPT Provider Note   CSN: [id number] Arrival date & time: 12/19/16  0331     History   Chief Complaint Chief Complaint  Patient presents with  . Detox    HPI Devin Hodge is a 46 y.o. male.  Patient requests detox from opiates. States he uses 15 oxycodone tablets of 10-15 mg per day. He denies any injection use. Denies any acetaminophen combination pills. He uses cocaine occasionally and snorts heroin occasionally. He denies any benzodiazepine or alcohol use. He denies any suicidal or homicidal thoughts. No hallucinations.  He denies any prescribed medications. He denies any chronic medical conditions or prescribed medications.   The history is provided by the patient.    Past Medical History:  Diagnosis Date  . Anxiety   . Depression   . Drug addiction in remission (HCC)   . Kidney stone   . PTSD (post-traumatic stress disorder)   . Status post alcohol detoxification     Patient Active Problem List   Diagnosis Date Noted  . Cocaine abuse with cocaine-induced mood disorder (HCC) 10/15/2016  . Cocaine abuse 02/23/2013  . Narcotic abuse 02/23/2013  . Alcohol dependence (HCC) 05/26/2012  . Opioid dependence (HCC) 05/26/2012    Past Surgical History:  Procedure Laterality Date  . PLEURAL SCARIFICATION         Home Medications    Prior to Admission medications   Medication Sig Start Date End Date Taking? Authorizing Provider  ibuprofen (ADVIL,MOTRIN) 600 MG tablet Take 1 tablet (600 mg total) by mouth every 6 (six) hours as needed. Patient not taking: Reported on 07/18/2016 11/29/15   Burgess AmorJulie Idol, PA-C  methocarbamol (ROBAXIN) 500 MG tablet Take 1 tablet (500 mg total) by mouth every 8 (eight) hours as needed for muscle spasms. Patient not taking: Reported on 07/18/2016 09/12/15   Loren Raceravid Yelverton, MD  predniSONE (DELTASONE) 20 MG tablet Take 2 tablets (40 mg total) by mouth daily. For 5 days Patient not taking: Reported on 10/14/2016 07/18/16    Tammy Triplett, PA-C  traMADol (ULTRAM) 50 MG tablet Take 1 tablet (50 mg total) by mouth every 6 (six) hours as needed. Patient not taking: Reported on 07/18/2016 11/29/15   Burgess AmorJulie Idol, PA-C    Family History No family history on file.  Social History Social History  Substance Use Topics  . Smoking status: Current Every Day Smoker    Packs/day: 0.50    Types: Cigarettes  . Smokeless tobacco: Former NeurosurgeonUser  . Alcohol use No     Comment: former-last use x 3 months     Allergies   Sulfa antibiotics   Review of Systems Review of Systems  Constitutional: Negative for activity change, appetite change and fever.  HENT: Negative for congestion.   Respiratory: Negative for cough, chest tightness and shortness of breath.   Cardiovascular: Negative for chest pain.  Gastrointestinal: Negative for abdominal pain, nausea and vomiting.  Genitourinary: Negative for dysuria and hematuria.  Musculoskeletal: Negative for arthralgias and myalgias.  Skin: Negative for rash.  Psychiatric/Behavioral: Negative for self-injury and suicidal ideas. The patient is not nervous/anxious.    A complete 10 system review of systems was obtained and all systems are negative except as noted in the HPI and PMH.    Physical Exam Updated Vital Signs BP 126/82 (BP Location: Right Arm)   Pulse 86   Temp 98.1 F (36.7 C) (Oral)   Resp 20   Ht 6' (1.829 m)   Wt 165 lb (74.8 kg)  SpO2 97%   BMI 22.38 kg/m   Physical Exam  Constitutional: He is oriented to person, place, and time. He appears well-developed and well-nourished. No distress.  HENT:  Head: Normocephalic and atraumatic.  Mouth/Throat: Oropharynx is clear and moist. No oropharyngeal exudate.  Eyes: Conjunctivae and EOM are normal. Pupils are equal, round, and reactive to light.  Neck: Normal range of motion. Neck supple.  No meningismus.  Cardiovascular: Normal rate, regular rhythm, normal heart sounds and intact distal pulses.   No murmur  heard. Pulmonary/Chest: Effort normal and breath sounds normal. No respiratory distress.  Abdominal: Soft. There is no tenderness. There is no rebound and no guarding.  Musculoskeletal: Normal range of motion. He exhibits no edema or tenderness.  Neurological: He is alert and oriented to person, place, and time. No cranial nerve deficit. He exhibits normal muscle tone. Coordination normal.  No ataxia on finger to nose bilaterally. No pronator drift. 5/5 strength throughout. CN 2-12 intact.Equal grip strength. Sensation intact.   Skin: Skin is warm.  Psychiatric: He has a normal mood and affect. His behavior is normal.  Nursing note and vitals reviewed.    ED Treatments / Results  Labs (all labs ordered are listed, but only abnormal results are displayed) Labs Reviewed  RAPID URINE DRUG SCREEN, HOSP PERFORMED - Abnormal; Notable for the following:       Result Value   Cocaine POSITIVE (*)    Tetrahydrocannabinol POSITIVE (*)    All other components within normal limits  URINALYSIS, ROUTINE W REFLEX MICROSCOPIC    EKG  EKG Interpretation None       Radiology No results found.  Procedures Procedures (including critical care time)  Medications Ordered in ED Medications - No data to display   Initial Impression / Assessment and Plan / ED Course  I have reviewed the triage vital signs and the nursing notes.  Pertinent labs & imaging results that were available during my care of the patient were reviewed by me and considered in my medical decision making (see chart for details).     Patient requesting detox from opiates and cocaine. Denies any IV drug abuse. Denies any alcohol or benzodiazepine use. D/w patient that inpatient opiate detox is not available through the ED.  Patient is stable without any evidence of life-threatening withdrawal.  Drug screen is interestingly negative for opiates. It is positive for cocaine and marijuana.  Patient is not suicidal or  homicidal. He is provided with outpatient resources for detoxification options. No evidence of life-threatening withdrawal. He is stable for discharge.  Final Clinical Impressions(s) / ED Diagnoses   Final diagnoses:  Polysubstance abuse    New Prescriptions New Prescriptions   No medications on file     Glynn Octave, MD 12/19/16 1610    Glynn Octave, MD 12/19/16 2256

## 2016-12-19 NOTE — Discharge Instructions (Addendum)
Follow up with the resources for substance abuse that are provided.  Return to the ED if you develop suicidal thoughts, homicidal thoughts or any other concerns.

## 2017-01-23 ENCOUNTER — Encounter (HOSPITAL_COMMUNITY): Payer: Self-pay

## 2017-01-23 ENCOUNTER — Emergency Department (HOSPITAL_COMMUNITY)
Admission: EM | Admit: 2017-01-23 | Discharge: 2017-01-23 | Disposition: A | Discharge status: Home / Self Care | Payer: Self-pay | Attending: Emergency Medicine | Admitting: Emergency Medicine

## 2017-01-23 DIAGNOSIS — K047 Periapical abscess without sinus: Secondary | ICD-10-CM | POA: Insufficient documentation

## 2017-01-23 DIAGNOSIS — F17210 Nicotine dependence, cigarettes, uncomplicated: Secondary | ICD-10-CM | POA: Insufficient documentation

## 2017-01-23 DIAGNOSIS — Z79899 Other long term (current) drug therapy: Secondary | ICD-10-CM | POA: Insufficient documentation

## 2017-01-23 MED ORDER — IBUPROFEN 800 MG PO TABS: 800.0000 mg | ORAL_TABLET | Freq: Three times a day (TID) | ORAL | 0 refills | Status: DC

## 2017-01-23 MED ORDER — AMOXICILLIN 500 MG PO CAPS: 500.0000 mg | ORAL_CAPSULE | Freq: Three times a day (TID) | ORAL | 0 refills | Status: DC

## 2017-01-23 MED ORDER — OXYCODONE-ACETAMINOPHEN 5-325 MG PO TABS
1.0000 | ORAL_TABLET | Freq: Once | ORAL | Status: AC
  Administered 2017-01-23: 1 via ORAL
  Filled 2017-01-23: qty 1

## 2017-01-23 MED ORDER — AMOXICILLIN 250 MG PO CAPS
1000.0000 mg | ORAL_CAPSULE | Freq: Once | ORAL | Status: AC
  Administered 2017-01-23: 1000 mg via ORAL
  Filled 2017-01-23: qty 4

## 2017-01-23 MED ORDER — IBUPROFEN 800 MG PO TABS
800.0000 mg | ORAL_TABLET | Freq: Once | ORAL | Status: AC
  Administered 2017-01-23: 800 mg via ORAL
  Filled 2017-01-23: qty 1

## 2017-01-23 NOTE — ED Triage Notes (Signed)
Patient states that he is having pain in the upper right side of his mouth, possible abscess.  Started bothering him yesterday.

## 2017-01-23 NOTE — ED Provider Notes (Signed)
[facility] DEPT Provider Note   CSN: [id number] Arrival date & time: 01/23/17  0414     History   Chief Complaint Chief Complaint  Patient presents with  . Dental Pain    HPI Devin Hodge is a 46 y.o. male.  Patient presents to the emergency part for evaluation of dental pain. Patient reports that the central upper teeth broke off a couple of months ago. In the last 24 hours, however, patient reports severe pain. He has noticed swelling of the gums around the area. No drainage. No fever.      Past Medical History:  Diagnosis Date  . Anxiety   . Depression   . Drug addiction in remission (HCC)   . Kidney stone   . PTSD (post-traumatic stress disorder)   . Status post alcohol detoxification     Patient Active Problem List   Diagnosis Date Noted  . Cocaine abuse with cocaine-induced mood disorder (HCC) 10/15/2016  . Cocaine abuse 02/23/2013  . Narcotic abuse 02/23/2013  . Alcohol dependence (HCC) 05/26/2012  . Opioid dependence (HCC) 05/26/2012    Past Surgical History:  Procedure Laterality Date  . PLEURAL SCARIFICATION         Home Medications    Prior to Admission medications   Medication Sig Start Date End Date Taking? Authorizing Provider  ibuprofen (ADVIL,MOTRIN) 600 MG tablet Take 1 tablet (600 mg total) by mouth every 6 (six) hours as needed. Patient not taking: Reported on 07/18/2016 11/29/15   Burgess AmorJulie Idol, PA-C  methocarbamol (ROBAXIN) 500 MG tablet Take 1 tablet (500 mg total) by mouth every 8 (eight) hours as needed for muscle spasms. Patient not taking: Reported on 07/18/2016 09/12/15   Loren Raceravid Yelverton, MD  predniSONE (DELTASONE) 20 MG tablet Take 2 tablets (40 mg total) by mouth daily. For 5 days Patient not taking: Reported on 10/14/2016 07/18/16   Tammy Triplett, PA-C  traMADol (ULTRAM) 50 MG tablet Take 1 tablet (50 mg total) by mouth every 6 (six) hours as needed. Patient not taking: Reported on 07/18/2016 11/29/15   Burgess AmorJulie Idol, PA-C     Family History History reviewed. No pertinent family history.  Social History Social History  Substance Use Topics  . Smoking status: Current Every Day Smoker    Packs/day: 0.50    Types: Cigarettes  . Smokeless tobacco: Former NeurosurgeonUser  . Alcohol use No     Comment: former-last use x 3 months     Allergies   Sulfa antibiotics   Review of Systems Review of Systems  HENT: Positive for dental problem.   All other systems reviewed and are negative.    Physical Exam Updated Vital Signs BP (!) 140/91 (BP Location: Right Arm)   Pulse 76   Temp 98.9 F (37.2 C) (Oral)   Resp 16   Ht 6' (1.829 m)   Wt 170 lb (77.1 kg)   SpO2 99%   BMI 23.06 kg/m   Physical Exam  Constitutional: He is oriented to person, place, and time. He appears well-developed and well-nourished. No distress.  HENT:  Head: Normocephalic and atraumatic.  Right Ear: Hearing normal.  Left Ear: Hearing normal.  Nose: Nose normal.  Mouth/Throat: Oropharynx is clear and moist and mucous membranes are normal. Abnormal dentition (Widespread dental decay with most teeth decayed and fractured at the gumline).  Swelling of the gingiva around the right upper central incisor region without fluctuance  Eyes: Conjunctivae and EOM are normal. Pupils are equal, round, and reactive to light.  Neck: Normal range of motion. Neck supple.  Cardiovascular: Regular rhythm, S1 normal and S2 normal.  Exam reveals no gallop and no friction rub.   No murmur heard. Pulmonary/Chest: Effort normal and breath sounds normal. No respiratory distress. He exhibits no tenderness.  Abdominal: Soft. Normal appearance and bowel sounds are normal. There is no hepatosplenomegaly. There is no tenderness. There is no rebound, no guarding, no tenderness at McBurney's point and negative Murphy's sign. No hernia.  Musculoskeletal: Normal range of motion.  Neurological: He is alert and oriented to person, place, and time. He has normal strength.  No cranial nerve deficit or sensory deficit. Coordination normal. GCS eye subscore is 4. GCS verbal subscore is 5. GCS motor subscore is 6.  Skin: Skin is warm, dry and intact. No rash noted. No cyanosis.  Psychiatric: He has a normal mood and affect. His speech is normal and behavior is normal. Thought content normal.  Nursing note and vitals reviewed.    ED Treatments / Results  Labs (all labs ordered are listed, but only abnormal results are displayed) Labs Reviewed - No data to display  EKG  EKG Interpretation None       Radiology No results found.  Procedures Procedures (including critical care time)  Medications Ordered in ED Medications  oxyCODONE-acetaminophen (PERCOCET/ROXICET) 5-325 MG per tablet 1 tablet (not administered)  ibuprofen (ADVIL,MOTRIN) tablet 800 mg (not administered)  amoxicillin (AMOXIL) capsule 1,000 mg (not administered)     Initial Impression / Assessment and Plan / ED Course  I have reviewed the triage vital signs and the nursing notes.  Pertinent labs & imaging results that were available during my care of the patient were reviewed by me and considered in my medical decision making (see chart for details).     Patient with dental pain around areas of severe dental decay. There is swelling and tenderness of the gingiva superior to the right central incisor, but no fluctuance to drain. Treat as early dental abscess.  Final Clinical Impressions(s) / ED Diagnoses   Final diagnoses:  None    New Prescriptions New Prescriptions   No medications on file     Gilda Crease, MD 01/23/17 0501

## 2017-02-13 ENCOUNTER — Ambulatory Visit (HOSPITAL_COMMUNITY)
Admission: RE | Admit: 2017-02-13 | Discharge: 2017-02-13 | Disposition: A | Discharge status: Home / Self Care | Payer: Self-pay | Attending: Psychiatry | Admitting: Psychiatry

## 2017-02-13 NOTE — Progress Notes (Signed)
  Patient ID: Devin Hodge, male   DOB: [birth year], 46 y.o.   MRN: [id number]  Pt presented as a walk-in but left without being seen. MSE was not able to be completed.  Laveda AbbeLaurie Britton Chella Chapdelaine 02-13-2017  1612

## 2018-01-29 ENCOUNTER — Ambulatory Visit: Payer: Self-pay | Admitting: Physician Assistant

## 2018-02-09 ENCOUNTER — Encounter: Payer: Self-pay | Admitting: Physician Assistant

## 2018-02-09 ENCOUNTER — Ambulatory Visit: Payer: Self-pay | Admitting: Physician Assistant

## 2018-02-09 VITALS — BP 125/79 | HR 74 | Temp 97.0°F | Ht 70.75 in | Wt 173.8 lb

## 2018-02-09 DIAGNOSIS — F1910 Other psychoactive substance abuse, uncomplicated: Secondary | ICD-10-CM

## 2018-02-09 DIAGNOSIS — Z8619 Personal history of other infectious and parasitic diseases: Secondary | ICD-10-CM

## 2018-02-09 DIAGNOSIS — Z13220 Encounter for screening for lipoid disorders: Secondary | ICD-10-CM

## 2018-02-09 DIAGNOSIS — R21 Rash and other nonspecific skin eruption: Secondary | ICD-10-CM

## 2018-02-09 DIAGNOSIS — Z7689 Persons encountering health services in other specified circumstances: Secondary | ICD-10-CM

## 2018-02-09 DIAGNOSIS — S30810A Abrasion of lower back and pelvis, initial encounter: Secondary | ICD-10-CM

## 2018-02-09 DIAGNOSIS — F17210 Nicotine dependence, cigarettes, uncomplicated: Secondary | ICD-10-CM

## 2018-02-09 DIAGNOSIS — M25571 Pain in right ankle and joints of right foot: Secondary | ICD-10-CM

## 2018-02-09 MED ORDER — TRIAMCINOLONE ACETONIDE 0.1 % EX CREA: 1.0000 "application " | TOPICAL_CREAM | Freq: Two times a day (BID) | CUTANEOUS | 0 refills | Status: DC | PRN

## 2018-02-09 NOTE — Progress Notes (Signed)
BP 125/79 (BP Location: Right Arm, Patient Position: Sitting, Cuff Size: Normal)   Pulse 74   Temp (!) 97 F (36.1 C)   Ht 5' 10.75" (1.797 m)   Wt 173 lb 12 oz (78.8 kg)   SpO2 96%   BMI 24.40 kg/m    Subjective:    Patient ID: Devin Hodge, male    DOB: 1971/07/03, 47 y.o.   MRN: [id number]  HPI: Devin Hodge is a 47 y.o. male presenting on 02/09/2018 for New Patient (Initial Visit) (pt has appointment at [facility] tomorrow. pt was released from prison 01/21/2018)   HPI   Chief Complaint  Patient presents with  . New Patient (Initial Visit)    pt has appointment at [facility] tomorrow. pt was released from prison 01/21/2018     Pt says he broke his foot in prison .  He says he was put in a splint for about 4 weeks.  He then got a cast that he wore for about 2 weeks.  He says he went to orthopedist in [place] on April 25 when he was still in prison.   He says the dr took an xray and was told it was okay.   He thinks the fibula was fractured  He also c/o skin on buttock is irritated for about a week.  He is putting antiseptic spray on it.  It started like little bumps and then he "scratched the hell out of it"   Taking history pt denies hx drugs other than oxycodone.  UDS from 2018 is + for cocaiine and MJ.   When asked about it, pt says he didn't understand my question.   Pt also states he was only ever a social drinker but chart notes history alcohol dependence.  Pt also has history Hep C.  He has never been treated for that.   Labs from 2013.   Relevant past medical, surgical, family and social history reviewed and updated as indicated. Interim medical history since our last visit reviewed. Allergies and medications reviewed and updated.   No current outpatient medications on file.  Review of Systems  Constitutional: Positive for fatigue. Negative for appetite change, chills, diaphoresis, fever and unexpected weight change.  HENT: Positive for congestion, dental  problem, ear pain, hearing loss and sneezing. Negative for drooling, facial swelling, mouth sores, sore throat, trouble swallowing and voice change.   Eyes: Positive for visual disturbance. Negative for pain, discharge, redness and itching.  Respiratory: Positive for cough. Negative for choking, shortness of breath and wheezing.   Cardiovascular: Positive for leg swelling. Negative for chest pain and palpitations.  Gastrointestinal: Negative for abdominal pain, blood in stool, constipation, diarrhea and vomiting.  Endocrine: Negative for cold intolerance, heat intolerance and polydipsia.  Genitourinary: Negative for decreased urine volume, dysuria and hematuria.  Musculoskeletal: Positive for arthralgias and gait problem. Negative for back pain.  Skin: Positive for rash.  Allergic/Immunologic: Negative for environmental allergies.  Neurological: Negative for seizures, syncope, light-headedness and headaches.  Hematological: Negative for adenopathy.  Psychiatric/Behavioral: Positive for agitation and dysphoric mood. Negative for suicidal ideas. The patient is nervous/anxious.     Per HPI unless specifically indicated above     Objective:    BP 125/79 (BP Location: Right Arm, Patient Position: Sitting, Cuff Size: Normal)   Pulse 74   Temp (!) 97 F (36.1 C)   Ht 5' 10.75" (1.797 m)   Wt 173 lb 12 oz (78.8 kg)   SpO2 96%  BMI 24.40 kg/m   Wt Readings from Last 3 Encounters:  02/09/18 173 lb 12 oz (78.8 kg)  01/23/17 170 lb (77.1 kg)  12/19/16 165 lb (74.8 kg)    Physical Exam  Constitutional: He is oriented to person, place, and time. He appears well-developed and well-nourished.  HENT:  Head: Normocephalic and atraumatic.  Mouth/Throat: Oropharynx is clear and moist. No oropharyngeal exudate.  Eyes: Pupils are equal, round, and reactive to light. Conjunctivae and EOM are normal.  Neck: Neck supple. No thyromegaly present.  Cardiovascular: Normal rate and regular rhythm.   Pulmonary/Chest: Effort normal and breath sounds normal. He has no wheezes. He has no rales.  Abdominal: Soft. Bowel sounds are normal. He exhibits no mass. There is no hepatosplenomegaly. There is no tenderness.  Musculoskeletal: He exhibits no edema.       Right ankle: He exhibits swelling. He exhibits normal range of motion and no ecchymosis. Tenderness.  There is swelling around the right ankle, both medially and laterally.  There is tenderness around the right ankle, both medially and laterally.  Good ROM.  + pulses.  No ecchymosis.   Lymphadenopathy:    He has no cervical adenopathy.  Neurological: He is alert and oriented to person, place, and time.  Skin: Skin is warm and dry. No rash noted.  Multiple excoriations on buttock.  No discrete lesions seen.  No secondary infection (nurse Berenice assisted)  Psychiatric: He has a normal mood and affect. His behavior is normal. Thought content normal.  Vitals reviewed.       Assessment & Plan:   Encounter Diagnoses  Name Primary?  . Encounter to establish care Yes  . Right ankle pain, unspecified chronicity   . Excoriation of buttock, initial encounter   . Rash   . History of hepatitis C   . Screening cholesterol level   . Cigarette nicotine dependence without complication   . Polysubstance abuse (HCC)    -will check baseline labs (Hep c, Chol, cmp) -record request sent to orthopedist in [facility] in refernce ankle injury  -will get Xray ankle -pt counseled to elevated the foot and use ice 10-20 minutes 3 or 4 times daily.  Naproxen or IBU prn -pt given application for [facility] charity care -pt to go to [facility] appointment tomorrow as scheduled -rx TAC cream to use as needed on itchy bumps.  Encouraged pt to avoid scratching -pt to Follow up 1 month.  RTO sooner prn

## 2018-02-10 ENCOUNTER — Ambulatory Visit (HOSPITAL_COMMUNITY)
Admission: RE | Admit: 2018-02-10 | Discharge: 2018-02-10 | Disposition: A | Discharge status: Home / Self Care | Payer: Self-pay | Source: Ambulatory Visit | Attending: Physician Assistant | Admitting: Physician Assistant

## 2018-02-10 ENCOUNTER — Other Ambulatory Visit: Payer: Self-pay | Admitting: Physician Assistant

## 2018-02-10 ENCOUNTER — Other Ambulatory Visit (HOSPITAL_COMMUNITY)
Admission: RE | Admit: 2018-02-10 | Discharge: 2018-02-10 | Disposition: A | Discharge status: Home / Self Care | Payer: Self-pay | Source: Ambulatory Visit | Attending: Physician Assistant | Admitting: Physician Assistant

## 2018-02-10 DIAGNOSIS — Z8619 Personal history of other infectious and parasitic diseases: Secondary | ICD-10-CM

## 2018-02-10 DIAGNOSIS — M25571 Pain in right ankle and joints of right foot: Secondary | ICD-10-CM | POA: Insufficient documentation

## 2018-02-10 DIAGNOSIS — Z13220 Encounter for screening for lipoid disorders: Secondary | ICD-10-CM

## 2018-02-10 DIAGNOSIS — F1910 Other psychoactive substance abuse, uncomplicated: Secondary | ICD-10-CM | POA: Insufficient documentation

## 2018-02-10 DIAGNOSIS — X58XXXA Exposure to other specified factors, initial encounter: Secondary | ICD-10-CM | POA: Insufficient documentation

## 2018-02-10 DIAGNOSIS — S82831K Other fracture of upper and lower end of right fibula, subsequent encounter for closed fracture with nonunion: Secondary | ICD-10-CM

## 2018-02-10 DIAGNOSIS — S82831A Other fracture of upper and lower end of right fibula, initial encounter for closed fracture: Secondary | ICD-10-CM | POA: Insufficient documentation

## 2018-02-10 LAB — COMPREHENSIVE METABOLIC PANEL
ALK PHOS: 61 U/L (ref 38–126)
ALT: 44 U/L (ref 17–63)
AST: 31 U/L (ref 15–41)
Albumin: 3.9 g/dL (ref 3.5–5.0)
Anion gap: 7 (ref 5–15)
BUN: 20 mg/dL (ref 6–20)
CALCIUM: 8.8 mg/dL — AB (ref 8.9–10.3)
CHLORIDE: 106 mmol/L (ref 101–111)
CO2: 24 mmol/L (ref 22–32)
Creatinine, Ser: 1.28 mg/dL — ABNORMAL HIGH (ref 0.61–1.24)
GFR calc non Af Amer: >60 mL/min (ref >60)
GLUCOSE: 119 mg/dL — AB (ref 65–99)
Potassium: 4 mmol/L (ref 3.5–5.1)
SODIUM: 137 mmol/L (ref 135–145)
Total Bilirubin: 0.5 mg/dL (ref 0.3–1.2)
Total Protein: 6.8 g/dL (ref 6.5–8.1)

## 2018-02-10 LAB — LIPID PANEL
Cholesterol: 106 mg/dL (ref 0–200)
HDL: 44 mg/dL (ref >40)
LDL CALC: 48 mg/dL (ref 0–99)
TRIGLYCERIDES: 72 mg/dL (ref <150)
Total CHOL/HDL Ratio: 2.4 RATIO
VLDL: 14 mg/dL (ref 0–40)

## 2018-02-11 LAB — HCV RNA QUANT
HCV QUANT: 2130000 [IU]/mL (ref >50)
HCV Quantitative Log: 6.328 log10 IU/mL (ref >1.70)

## 2018-02-12 ENCOUNTER — Other Ambulatory Visit: Payer: Self-pay

## 2018-02-12 ENCOUNTER — Encounter (HOSPITAL_COMMUNITY): Payer: Self-pay | Admitting: Emergency Medicine

## 2018-02-12 ENCOUNTER — Emergency Department (HOSPITAL_COMMUNITY)
Admission: EM | Admit: 2018-02-12 | Discharge: 2018-02-12 | Disposition: A | Discharge status: Home / Self Care | Payer: Self-pay | Attending: Emergency Medicine | Admitting: Emergency Medicine

## 2018-02-12 ENCOUNTER — Telehealth: Payer: Self-pay | Admitting: Orthopedic Surgery

## 2018-02-12 DIAGNOSIS — S82201G Unspecified fracture of shaft of right tibia, subsequent encounter for closed fracture with delayed healing: Secondary | ICD-10-CM

## 2018-02-12 DIAGNOSIS — S82401G Unspecified fracture of shaft of right fibula, subsequent encounter for closed fracture with delayed healing: Secondary | ICD-10-CM

## 2018-02-12 DIAGNOSIS — S82391G Other fracture of lower end of right tibia, subsequent encounter for closed fracture with delayed healing: Secondary | ICD-10-CM | POA: Insufficient documentation

## 2018-02-12 DIAGNOSIS — Y33XXXD Other specified events, undetermined intent, subsequent encounter: Secondary | ICD-10-CM | POA: Insufficient documentation

## 2018-02-12 DIAGNOSIS — F17210 Nicotine dependence, cigarettes, uncomplicated: Secondary | ICD-10-CM | POA: Insufficient documentation

## 2018-02-12 DIAGNOSIS — S82831G Other fracture of upper and lower end of right fibula, subsequent encounter for closed fracture with delayed healing: Secondary | ICD-10-CM | POA: Insufficient documentation

## 2018-02-12 MED ORDER — NAPROXEN 375 MG PO TABS: 375.0000 mg | ORAL_TABLET | Freq: Two times a day (BID) | ORAL | 0 refills | Status: DC

## 2018-02-12 NOTE — ED Triage Notes (Signed)
Pt broke right ankle while incarcerated 9 weeks ago. States still hurts.  Denies new injury. Nad.

## 2018-02-12 NOTE — ED Provider Notes (Signed)
[name]   CSN: [id number] Arrival date & time: 02/12/18  1251     History   Chief Complaint Chief Complaint  Patient presents with  . Foot Pain    HPI Devin Hodge is a 47 y.o. male with a history of drug abuse who presents the emergency department today for right ankle pain.  Patient states that he was recently incarcerated.  He states that during his time he suffered a ankle fracture after slipping while in the cafeteria.  He states that he was seen by a doctor while in the hospital and splinted for 4 weeks, followed by 2 weeks of wearing a cam walker.  He states that he had repeat x-rays and was told that everything was fine.  Since that time he has been ambulating on the ankle but notes that it swells daily and is also very painful when he is walking on it.  He is unsure if he still fractured this.  He reports that [facility] and got [facility] x-rays and is wanting to know what they say.  He denies any reinjury to the area, fever, numbness/tingling/weakness.  HPI  Past Medical History:  Diagnosis Date  . Anxiety   . Depression   . Drug addiction in remission (HCC)   . GERD (gastroesophageal reflux disease)   . Kidney stone   . PTSD (post-traumatic stress disorder)   . Status post alcohol detoxification     Patient Active Problem List   Diagnosis Date Noted  . Cocaine abuse with cocaine-induced mood disorder (HCC) 10/15/2016  . Cocaine abuse (HCC) 02/23/2013  . Narcotic abuse (HCC) 02/23/2013  . Alcohol dependence (HCC) 05/26/2012  . Opioid dependence (HCC) 05/26/2012    Past Surgical History:  Procedure Laterality Date  . PLEURAL SCARIFICATION          Home Medications    Prior to Admission medications   Medication Sig Start Date End Date Taking? Authorizing Provider  triamcinolone cream (KENALOG) 0.1 % Apply 1 application topically 2 (two) times daily as needed. 02/09/18   Jacquelin Hawking, PA-C     Family History Family History  Problem Relation Age of Onset  . Alcohol abuse Father   . Hypertension Father     Social History Social History   Tobacco Use  . Smoking status: Current Every Day Smoker    Packs/day: 1.00    Years: 30.00    Pack years: 30.00    Types: Cigarettes  . Smokeless tobacco: Never Used  Substance Use Topics  . Alcohol use: No    Comment: former-last use x 3 months  . Drug use: Yes    Types: Oxycodone    Comment: oxycodone. last use 11/2017     Allergies   Sulfa antibiotics   Review of Systems Review of Systems  All other systems reviewed and are negative.    Physical Exam Updated Vital Signs BP 137/87 (BP Location: Right Arm)   Pulse 77   Temp 98.5 F (36.9 C) (Oral)   Resp 18   Ht 6' (1.829 m)   Wt 79.4 kg (175 lb)   SpO2 99%   BMI 23.73 kg/m   Physical Exam  Constitutional: He appears well-developed and well-nourished.  HENT:  Head: Normocephalic and atraumatic.  Right Ear: External ear normal.  Left Ear: External ear normal.  Eyes: Conjunctivae are normal. Right eye exhibits no discharge. Left eye exhibits no discharge. No scleral icterus.  Pulmonary/Chest: Effort normal.  No respiratory distress.  Musculoskeletal:       Right knee: Normal.       Right ankle: He exhibits decreased range of motion (2/2 pain) and swelling. Tenderness. Lateral malleolus tenderness found. Achilles tendon normal.       Right foot: Normal.  Neurovascularly intact distally. Compartments soft  Neurological: He is alert. He has normal strength. No sensory deficit.  Skin: Skin is warm, dry and intact. Capillary refill takes less than 2 seconds. No erythema. No pallor.  Psychiatric: He has a normal mood and affect.  Nursing note and vitals reviewed.    ED Treatments / Results  Labs (all labs ordered are listed, but only abnormal results are displayed) Labs Reviewed - No data to display  EKG None  Radiology Dg Ankle Complete  Right  Result Date: 02/10/2018 CLINICAL DATA:  History of ankle fracture 8 weeks ago. EXAM: RIGHT ANKLE - COMPLETE 3+ VIEW COMPARISON:  None. FINDINGS: There is an obliquely oriented, displaced fracture of the distal fibula with extension to involve the distal tib-fib joint as well as the ankle mortise. Ankle mortise appears preserved given obliquity. Suspected small ankle joint effusion. Adjacent soft tissue swelling. Presumed dermal calcification overlies the midfoot. No definite radiopaque foreign body. IMPRESSION: Obliquely oriented minimally displaced fracture of the distal fibula with extension to involve both the tib-fib joint as well as the ankle mortise. Electronically Signed   By: Simonne Come M.D.   On: 02/10/2018 16:40    Procedures Procedures (including critical care time)  Medications Ordered in ED Medications - No data to display   Initial Impression / Assessment and Plan / ED Course  I have reviewed the triage vital signs and the nursing notes.  Pertinent labs & imaging results that were available during my care of the patient were reviewed by me and considered in my medical decision making (see chart for details).     47 y.o. male who suffered ankle fracture while in jail approximately 9 weeks ago presents the emergent Luz Brazen today for continued pain.  Patient received x-ray on outpatient basis.  Patient's x-ray reviewed.  He has a minimally displaced fracture of the distal fibula with extension involving both the tib-fib as well as the ankle mortise.  Patient's compartments are soft and is neurovascular intact.  No evidence of compartment syndrome.  No evidence of nerve impingement or arterial occlusion.  Will place patient in a cam walker boot and dry crutches.  Patient is remain nonweightbearing.  I will for the past to orthopedics for further management. Will avoid narcotic rx given patient's history of narcotic dependence. Specific return precautions discussed. Time was given  for all questions to be answered. The patient verbalized understanding and agreement with plan. The patient appears safe for discharge home. Patient case discussed with Dr. Adriana Simas who is in agreement with plan.  Final Clinical Impressions(s) / ED Diagnoses   Final diagnoses:  Closed fracture of right tibia and fibula with delayed healing, subsequent encounter    ED Discharge Orders        Ordered    naproxen (NAPROSYN) 375 MG tablet  2 times daily     02/12/18 1400       Princella Pellegrini 02/12/18 1402    Donnetta Hutching, MD 02/13/18 [phone]

## 2018-02-12 NOTE — Discharge Instructions (Addendum)
Your xray shows Obliquely oriented minimally displaced fracture of the distal fibula with extension to involve both the tib-fib joint as well as the ankle mortise.  Please stay non-weightbearing and use cam walker and crutches.  Please follow up with orthopedics. Call as soon as you leave the department to schedule follow up.    Return instructions:  Please return if your toes or feet are numb or tingling, appear gray or blue, or you have severe pain (also elevate the leg and loosen splint or wrap if you were given one) Please return to the Emergency Department if you experience worsening symptoms.  Please return if you have any other emergent concerns. Additional Information:  Your vital signs today were: BP 137/87 (BP Location: Right Arm)    Pulse 77    Temp 98.5 F (36.9 C) (Oral)    Resp 18    Ht 6' (1.829 m)    Wt 79.4 kg (175 lb)    SpO2 99%    BMI 23.73 kg/m  If your blood pressure (BP) was elevated above 135/85 this visit, please have this repeated by your doctor within one month. ---------------

## 2018-02-12 NOTE — Telephone Encounter (Signed)
Patient called for an appointment to see Dr. Romeo Apple. I explained that Dr. Romeo Apple would need to review his notes and xray before I could schedule him an appointment. He stated he understood and will wait for a return call from our office.

## 2018-02-12 NOTE — ED Notes (Signed)
Xray done 02/10/18 of right ankle

## 2018-02-12 NOTE — ED Notes (Signed)
ED Provider at bedside. 

## 2018-03-11 ENCOUNTER — Encounter: Payer: Self-pay | Admitting: Physician Assistant

## 2018-03-11 ENCOUNTER — Ambulatory Visit: Payer: Self-pay | Admitting: Physician Assistant

## 2018-03-11 VITALS — BP 118/80 | HR 74 | Temp 98.1°F | Ht 70.75 in | Wt 175.0 lb

## 2018-03-11 DIAGNOSIS — B182 Chronic viral hepatitis C: Secondary | ICD-10-CM

## 2018-03-11 DIAGNOSIS — S82831K Other fracture of upper and lower end of right fibula, subsequent encounter for closed fracture with nonunion: Secondary | ICD-10-CM

## 2018-03-11 DIAGNOSIS — R7989 Other specified abnormal findings of blood chemistry: Secondary | ICD-10-CM

## 2018-03-11 DIAGNOSIS — F17210 Nicotine dependence, cigarettes, uncomplicated: Secondary | ICD-10-CM

## 2018-03-11 NOTE — Progress Notes (Signed)
BP 118/80 (BP Location: Left Arm, Patient Position: Sitting, Cuff Size: Normal)   Pulse 74   Temp 98.1 F (36.7 C) (Other (Comment))   Ht 5' 10.75" (1.797 m)   Wt 175 lb (79.4 kg)   SpO2 97%   BMI 24.58 kg/m    Subjective:    Patient ID: Devin Hodge, male    DOB: 23-Oct-1970, 47 y.o.   MRN: [id number]  HPI: Devin Hodge is a 47 y.o. male presenting on 03/11/2018 for Follow-up   HPI   Pt says he went to [facility] since his last OV here.  He has appointment with orthopedics this Friday.  Pt says he turned in his [facility] application.   Relevant past medical, surgical, family and social history reviewed and updated as indicated. Interim medical history since our last visit reviewed. Allergies and medications reviewed and updated.   Current Outpatient Medications:  .  ibuprofen (ADVIL,MOTRIN) 200 MG tablet, Take 400 mg by mouth 3 (three) times daily as needed., Disp: , Rfl:   Review of Systems  Constitutional: Negative for appetite change, chills, diaphoresis, fatigue, fever and unexpected weight change.  HENT: Negative for congestion, dental problem, drooling, ear pain, facial swelling, hearing loss, mouth sores, sneezing, sore throat, trouble swallowing and voice change.   Eyes: Negative for pain, discharge, redness, itching and visual disturbance.  Respiratory: Negative for cough, choking, shortness of breath and wheezing.   Cardiovascular: Negative for chest pain, palpitations and leg swelling.  Gastrointestinal: Negative for abdominal pain, blood in stool, constipation, diarrhea and vomiting.  Endocrine: Negative for cold intolerance, heat intolerance and polydipsia.  Genitourinary: Negative for decreased urine volume, dysuria and hematuria.  Musculoskeletal: Negative for arthralgias, back pain and gait problem.  Skin: Negative for rash.  Allergic/Immunologic: Negative for environmental allergies.  Neurological: Negative for seizures, syncope,  light-headedness and headaches.  Hematological: Negative for adenopathy.  Psychiatric/Behavioral: Negative for agitation, dysphoric mood and suicidal ideas. The patient is not nervous/anxious.     Per HPI unless specifically indicated above     Objective:    BP 118/80 (BP Location: Left Arm, Patient Position: Sitting, Cuff Size: Normal)   Pulse 74   Temp 98.1 F (36.7 C) (Other (Comment))   Ht 5' 10.75" (1.797 m)   Wt 175 lb (79.4 kg)   SpO2 97%   BMI 24.58 kg/m   Wt Readings from Last 3 Encounters:  03/11/18 175 lb (79.4 kg)  02/12/18 175 lb (79.4 kg)  02/09/18 173 lb 12 oz (78.8 kg)    Physical Exam  Constitutional: He is oriented to person, place, and time. He appears well-developed and well-nourished.  HENT:  Head: Normocephalic and atraumatic.  Pulmonary/Chest: Effort normal. No respiratory distress.  Neurological: He is alert and oriented to person, place, and time.  Skin: Skin is warm and dry.  Psychiatric: He has a normal mood and affect. His behavior is normal.  Nursing note and vitals reviewed.   Results for orders placed or performed during the hospital encounter of 02/10/18  HCV RNA quant  Result Value Ref Range   HCV Quantitative 2,130,000 >50 IU/mL   HCV Quantitative Log 6.328 >1.70 log10 IU/mL   Test Information Comment   Lipid panel  Result Value Ref Range   Cholesterol 106 0 - 200 mg/dL   Triglycerides 72 <409 mg/dL   HDL 44 >81 mg/dL   Total CHOL/HDL Ratio 2.4 RATIO   VLDL 14 0 - 40 mg/dL   LDL Cholesterol 48 0 -  99 mg/dL  Comprehensive metabolic panel  Result Value Ref Range   Sodium 137 135 - 145 mmol/L   Potassium 4.0 3.5 - 5.1 mmol/L   Chloride 106 101 - 111 mmol/L   CO2 24 22 - 32 mmol/L   Glucose, Bld 119 (H) 65 - 99 mg/dL   BUN 20 6 - 20 mg/dL   Creatinine, Ser 1.611.28 (H) 0.61 - 1.24 mg/dL   Calcium 8.8 (L) 8.9 - 10.3 mg/dL   Total Protein 6.8 6.5 - 8.1 g/dL   Albumin 3.9 3.5 - 5.0 g/dL   AST 31 15 - 41 U/L   ALT 44 17 - 63 U/L    Alkaline Phosphatase 61 38 - 126 U/L   Total Bilirubin 0.5 0.3 - 1.2 mg/dL   GFR calc non Af Amer >60 >60 mL/min   GFR calc Af Amer >60 >60 mL/min   Anion gap 7 5 - 15      Assessment & Plan:   Encounter Diagnoses  Name Primary?  . Chronic hepatitis C without hepatic coma (HCC) Yes  . Elevated serum creatinine   . 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 Cigarette nicotine dependence without complication     -reviewed labs with pt -Refer to GI for hepatitis -Gave pt phone number for financial counselor to check on his charity care application -counseled pt to Decrease nsaids due to mildly elevated kidney function -pt to follow up 6 months to Recheck kidney function pt to RTO sooner prn

## 2018-03-11 NOTE — Patient Instructions (Signed)
Financial Counselor- [phone]  

## 2018-03-13 ENCOUNTER — Encounter: Payer: Self-pay | Admitting: Orthopedic Surgery

## 2018-03-13 ENCOUNTER — Ambulatory Visit (INDEPENDENT_AMBULATORY_CARE_PROVIDER_SITE_OTHER): Payer: Self-pay | Admitting: Orthopedic Surgery

## 2018-03-13 ENCOUNTER — Ambulatory Visit (INDEPENDENT_AMBULATORY_CARE_PROVIDER_SITE_OTHER): Payer: Self-pay

## 2018-03-13 VITALS — BP 134/86 | HR 78 | Ht 70.5 in | Wt 175.0 lb

## 2018-03-13 DIAGNOSIS — S82891A Other fracture of right lower leg, initial encounter for closed fracture: Secondary | ICD-10-CM

## 2018-03-13 DIAGNOSIS — S8261XG Displaced fracture of lateral malleolus of right fibula, subsequent encounter for closed fracture with delayed healing: Secondary | ICD-10-CM

## 2018-03-13 MED ORDER — TRAMADOL-ACETAMINOPHEN 37.5-325 MG PO TABS: 1.0000 | ORAL_TABLET | Freq: Three times a day (TID) | ORAL | 5 refills | Status: DC | PRN

## 2018-03-13 NOTE — Progress Notes (Signed)
NEW PATIENT OFFICE VISIT   Chief Complaint  Patient presents with  . Ankle Injury    Right ankle injury, DOI around 01-13-18.     MEDICAL DECISION SECTION  Xrays were done at [facility] on 21 May.  They show a lateral malleolus fracture with some medial gutter bone formation are to see if it is chronic or acute  Today's x-ray in the office show a lateral malleolus Weber B fracture slight rotational alignment to the distal fragment ankle syndesmosis looks intact with medial clear space showing some widening and again bone formation in that area history important to try to figure out what that is.  This was an independent interpretation of the x-ray findings for the May 21 x-ray and then intraoffice x-ray and reading please see report   Encounter Diagnosis  Name Primary?  . 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  The x-ray does not show any healing of the fracture site  Recommend referral to foot and ankle specialist to see if he needs to have the fracture taken down rotation alignment corrected and then medial gutter cleaned out and then plating  No orders of the defined types were placed in this encounter.   Chief Complaint  Patient presents with  . Ankle Injury    Right ankle injury, DOI around 01-13-18.    47 year old male [facility] presents for evaluation of a right ankle fracture  The patient states he was injured around April 1 he fractured his ankle he went to a orthopedist while he was in a prison facility and he was placed in a splint for 3 weeks he was then placed in a cast for 2 weeks and then a boot.  There was a period of time while he was at the prison facility where he was not allowed to be nonweightbearing.  He now comes in with medial and lateral  ankle pain swelling trouble walking associated with swelling on both sides of his ankle.  Pain is worse at night associated with throbbing despite taking ibuprofen and Tylenol   Review of Systems  Constitutional: Negative for chills and fever.  Neurological: Negative for tingling, sensory change and focal weakness.     Past Medical History:  Diagnosis Date  . Anxiety   . Depression   . Drug addiction in remission (HCC)   . GERD (gastroesophageal reflux disease)   . Kidney stone   . PTSD (post-traumatic stress disorder)   . Status post alcohol detoxification     Past Surgical History:  Procedure Laterality Date  . PLEURAL SCARIFICATION      Family History  Problem Relation Age of Onset  . Alcohol abuse Father   . Hypertension Father    Social History   Tobacco Use  . Smoking status: Current Every Day Smoker    Packs/day: 1.00    Years: 30.00    Pack years: 30.00    Types: Cigarettes  . Smokeless tobacco: Never Used  Substance Use Topics  . Alcohol use: No    Comment: former-last use x 3 months  . Drug use: Yes    Types: Oxycodone, Marijuana, Cocaine    Allergies  Allergen Reactions  . Sulfa Antibiotics Hives    Current Meds  Medication Sig  . ibuprofen (ADVIL,MOTRIN) 200 MG tablet Take  400 mg by mouth 3 (three) times daily as needed.    BP 134/86   Pulse 78   Ht 5' 10.5" (1.791 m)   Wt 175 lb (79.4 kg)   BMI 24.75 kg/m   Physical Exam  Constitutional: He is oriented to person, place, and time. He appears well-developed and well-nourished.  Cardiovascular: Intact distal pulses.  Musculoskeletal:       Feet:  Neurological: He is alert and oriented to person, place, and time. No sensory deficit. He exhibits normal muscle tone. Coordination normal.  Skin: Skin is warm and dry. Capillary refill takes less than 2 seconds. No rash noted. No erythema. No pallor.  Psychiatric: He has a normal mood and affect. His speech is normal and behavior is normal.  Judgment and thought content normal. Cognition and memory are normal.    Ortho Exam  Gait weight-bear as tolerated in cam walker     Fuller CanadaStanley Harrison, MD  03/13/2018 10:06 AM

## 2018-03-13 NOTE — Addendum Note (Signed)
Addended by: Vickki HearingHARRISON, Layonna Dobie E on: 03/13/2018 10:18 AM   Modules accepted: Orders

## 2018-03-17 ENCOUNTER — Encounter: Payer: Self-pay | Admitting: Internal Medicine

## 2018-03-19 ENCOUNTER — Encounter (INDEPENDENT_AMBULATORY_CARE_PROVIDER_SITE_OTHER): Payer: Self-pay | Admitting: Orthopedic Surgery

## 2018-03-19 ENCOUNTER — Other Ambulatory Visit (INDEPENDENT_AMBULATORY_CARE_PROVIDER_SITE_OTHER): Payer: Self-pay | Admitting: Orthopedic Surgery

## 2018-03-19 ENCOUNTER — Ambulatory Visit (INDEPENDENT_AMBULATORY_CARE_PROVIDER_SITE_OTHER): Payer: Self-pay | Admitting: Orthopedic Surgery

## 2018-03-19 VITALS — Ht 70.0 in | Wt 175.0 lb

## 2018-03-19 DIAGNOSIS — S82899K Other fracture of unspecified lower leg, subsequent encounter for closed fracture with nonunion: Secondary | ICD-10-CM

## 2018-03-19 DIAGNOSIS — S92909K Unspecified fracture of unspecified foot, subsequent encounter for fracture with nonunion: Principal | ICD-10-CM

## 2018-03-19 DIAGNOSIS — S8261XP Displaced fracture of lateral malleolus of right fibula, subsequent encounter for closed fracture with malunion: Secondary | ICD-10-CM

## 2018-03-19 NOTE — Progress Notes (Signed)
Office Visit Note   Patient: Devin Hodge           Date of Birth: Jun 02, 1971           MRN: [id number] Visit Date: 03/19/2018              Requested by: Jacquelin Hawking, PA-C [address] [place], [place] [address] PCP: Jacquelin Hawking, PA-C  Chief Complaint  Patient presents with  . Right Ankle - Pain      HPI: Patient is a 47 year old gentleman who states he broke his ankle on January 13, 2018.  Patient was initially treated in a splint cast and fracture boot.  Patient subsequently followed up with Dr. Romeo Apple in [place] with a malunion.  Patient is seen today in consultation for treatment of the malunion ankle fracture.  Past medical history significant for smoking and history of hepatitis C.  Assessment & Plan: Visit Diagnoses:  1. 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  Risks and benefits were discussed including infection neurovascular injury arthritis persistent pain need for additional surgery patient states he understands wished to proceed.  Discussed that he will need to stop smoking at this time and not smoke for 3 months.  Discussed that with persistent smoking patient is at risk for a below the knee amputation.  Patient states he understands wished to proceed with surgery.  Follow-Up Instructions: Return in about 2 weeks (around 04/02/2018).   Ortho Exam  Patient is alert, oriented, no adenopathy, well-dressed, normal affect, normal respiratory effort. Examination patient does have swelling around the right ankle he has good hair growth on his foot he has a palpable dorsalis pedis and posterior tibial pulse.  He is tender to palpation over the lateral  medial malleolus.  He has bony fragments within the medial joint space the medial joint space is widened there is narrowing of the lateral joint space with a malunion of the fibula fracture which is shortened and rotated.  Imaging: No results found. No images are attached to the encounter.  Labs: Lab Results  Component Value Date   REPTSTATUS 10/22/2011 FINAL 10/21/2011   GRAMSTAIN  03/09/2008    RARE WBC PRESENT, PREDOMINANTLY PMN NO SQUAMOUS EPITHELIAL CELLS SEEN RARE GRAM POSITIVE COCCI IN PAIRS   CULT INSIGNIFICANT GROWTH 10/21/2011   LABORGA METHICILLIN RESISTANT STAPHYLOCOCCUS AUREUS 03/09/2008     Lab Results  Component Value Date   ALBUMIN 3.9 02/10/2018   ALBUMIN 3.8 10/14/2016   ALBUMIN 3.8 02/23/2013    Body mass index is 25.11 kg/m.  Orders:  No orders of the defined types were placed in this encounter.  No orders of the defined types were placed in this encounter.    Procedures: No procedures performed  Clinical Data: No additional findings.  ROS:  All other systems negative, except as noted in the HPI. Review of Systems  Objective: Vital Signs: Ht 5\' 10"  (1.778 m)   Wt 175 lb (79.4 kg)   BMI 25.11 kg/m   Specialty Comments:  No specialty comments available.  PMFS History: Patient Active Problem List   Diagnosis Date Noted  . 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  Cocaine abuse with cocaine-induced mood disorder (HCC) 10/15/2016  . Cocaine abuse (HCC) 02/23/2013  . Narcotic abuse (HCC) 02/23/2013  . Alcohol dependence (HCC) 05/26/2012  . Opioid dependence (HCC) 05/26/2012   Past Medical History:  Diagnosis Date  . Anxiety   . Depression   . Drug addiction in remission (HCC)   . GERD (gastroesophageal reflux disease)   . Kidney stone   . PTSD (post-traumatic stress disorder)   . Status post alcohol detoxification     Family History  Problem Relation Age of  Onset  . Alcohol abuse Father   . Hypertension Father     Past Surgical History:  Procedure Laterality Date  . PLEURAL SCARIFICATION     Social History   Occupational History  . Not on file  Tobacco Use  . Smoking status: Current Every Day Smoker    Packs/day: 1.00    Years: 30.00    Pack years: 30.00    Types: Cigarettes  . Smokeless tobacco: Never Used  Substance and Sexual Activity  . Alcohol use: No    Comment: former-last use x 3 months  . Drug use: Yes    Types: Oxycodone, Marijuana, Cocaine  . Sexual activity: Yes    Birth control/protection: None

## 2018-03-24 ENCOUNTER — Other Ambulatory Visit: Payer: Self-pay

## 2018-03-24 ENCOUNTER — Encounter (HOSPITAL_COMMUNITY): Payer: Self-pay | Admitting: *Deleted

## 2018-03-24 NOTE — Progress Notes (Signed)
Pt denies SOB, chest pain, and being under the care of a cardiologist. Pt denies having a stress test, echo and cardiac cath. Pt denies having a chest x ray within the last year. Pt stated " I had an EKG when I broke my foot but I can't remember the name of the hospital." Pt denies recent labs. Pt made aware to stop taking vitamins, fish oil and herbal medications. Do not take any NSAIDs ie: Ibuprofen, Advil, Naproxen (Aleve), Motrin, BC and Goody Powder. Pt verbalized understanding of all pre-op instructions.

## 2018-03-25 ENCOUNTER — Encounter (HOSPITAL_COMMUNITY)
Admission: RE | Disposition: A | Discharge status: Home / Self Care | Payer: Self-pay | Source: Ambulatory Visit | Attending: Orthopedic Surgery

## 2018-03-25 ENCOUNTER — Ambulatory Visit (HOSPITAL_COMMUNITY): Payer: Self-pay | Admitting: Certified Registered Nurse Anesthetist

## 2018-03-25 ENCOUNTER — Encounter (HOSPITAL_COMMUNITY): Payer: Self-pay | Admitting: Urology

## 2018-03-25 ENCOUNTER — Ambulatory Visit (HOSPITAL_COMMUNITY)
Admission: RE | Admit: 2018-03-25 | Discharge: 2018-03-25 | Disposition: A | Discharge status: Home / Self Care | Payer: Self-pay | Source: Ambulatory Visit | Attending: Orthopedic Surgery | Admitting: Orthopedic Surgery

## 2018-03-25 DIAGNOSIS — S92909K Unspecified fracture of unspecified foot, subsequent encounter for fracture with nonunion: Secondary | ICD-10-CM

## 2018-03-25 DIAGNOSIS — S82899K Other fracture of unspecified lower leg, subsequent encounter for closed fracture with nonunion: Secondary | ICD-10-CM

## 2018-03-25 DIAGNOSIS — F17210 Nicotine dependence, cigarettes, uncomplicated: Secondary | ICD-10-CM | POA: Insufficient documentation

## 2018-03-25 DIAGNOSIS — X58XXXD Exposure to other specified factors, subsequent encounter: Secondary | ICD-10-CM | POA: Insufficient documentation

## 2018-03-25 DIAGNOSIS — S82401K Unspecified fracture of shaft of right fibula, subsequent encounter for closed fracture with nonunion: Secondary | ICD-10-CM | POA: Insufficient documentation

## 2018-03-25 HISTORY — DX: Personal history of other diseases of the musculoskeletal system and connective tissue: Z87.39

## 2018-03-25 HISTORY — PX: ORIF ANKLE FRACTURE: SHX5408

## 2018-03-25 HISTORY — DX: Personal history of (healed) traumatic fracture: Z87.81

## 2018-03-25 LAB — COMPREHENSIVE METABOLIC PANEL
ALT: 52 U/L — ABNORMAL HIGH (ref 0–44)
ANION GAP: 9 (ref 5–15)
AST: 40 U/L (ref 15–41)
Albumin: 3.7 g/dL (ref 3.5–5.0)
Alkaline Phosphatase: 55 U/L (ref 38–126)
BUN: 14 mg/dL (ref 6–20)
CALCIUM: 9.2 mg/dL (ref 8.9–10.3)
CHLORIDE: 108 mmol/L (ref 98–111)
CO2: 25 mmol/L (ref 22–32)
Creatinine, Ser: 1.12 mg/dL (ref 0.61–1.24)
Glucose, Bld: 80 mg/dL (ref 70–99)
POTASSIUM: 3.6 mmol/L (ref 3.5–5.1)
Sodium: 142 mmol/L (ref 135–145)
Total Bilirubin: 1 mg/dL (ref 0.3–1.2)
Total Protein: 7.3 g/dL (ref 6.5–8.1)

## 2018-03-25 LAB — CBC
HCT: 43 % (ref 39.0–52.0)
HEMOGLOBIN: 14 g/dL (ref 13.0–17.0)
MCH: 30.6 pg (ref 26.0–34.0)
MCHC: 32.6 g/dL (ref 30.0–36.0)
MCV: 93.9 fL (ref 78.0–100.0)
Platelets: 208 10*3/uL (ref 150–400)
RBC: 4.58 MIL/uL (ref 4.22–5.81)
RDW: 13.3 % (ref 11.5–15.5)
WBC: 8.4 10*3/uL (ref 4.0–10.5)

## 2018-03-25 SURGERY — OPEN REDUCTION INTERNAL FIXATION (ORIF) ANKLE FRACTURE
Anesthesia: General | Laterality: Right

## 2018-03-25 MED ORDER — PROPOFOL 10 MG/ML IV BOLUS
INTRAVENOUS | Status: DC | PRN
  Administered 2018-03-25: 200 mg via INTRAVENOUS

## 2018-03-25 MED ORDER — ONDANSETRON HCL 4 MG/2ML IJ SOLN
INTRAMUSCULAR | Status: DC | PRN
  Administered 2018-03-25: 4 mg via INTRAVENOUS

## 2018-03-25 MED ORDER — DEXAMETHASONE SODIUM PHOSPHATE 10 MG/ML IJ SOLN
INTRAMUSCULAR | Status: DC | PRN
  Administered 2018-03-25: 10 mg via INTRAVENOUS

## 2018-03-25 MED ORDER — FENTANYL CITRATE (PF) 100 MCG/2ML IJ SOLN
INTRAMUSCULAR | Status: AC
  Administered 2018-03-25: 100 ug via INTRAVENOUS
  Filled 2018-03-25: qty 2

## 2018-03-25 MED ORDER — ROPIVACAINE HCL 5 MG/ML IJ SOLN
INTRAMUSCULAR | Status: DC | PRN
  Administered 2018-03-25: 50 mL via PERINEURAL

## 2018-03-25 MED ORDER — MIDAZOLAM HCL 2 MG/2ML IJ SOLN
INTRAMUSCULAR | Status: AC
  Administered 2018-03-25: 2 mg via INTRAVENOUS
  Filled 2018-03-25: qty 2

## 2018-03-25 MED ORDER — CHLORHEXIDINE GLUCONATE 4 % EX LIQD: 60.0000 mL | Freq: Once | CUTANEOUS | Status: DC

## 2018-03-25 MED ORDER — CEFAZOLIN SODIUM-DEXTROSE 2-4 GM/100ML-% IV SOLN
2.0000 g | INTRAVENOUS | Status: AC
  Administered 2018-03-25: 2 g via INTRAVENOUS

## 2018-03-25 MED ORDER — OXYCODONE-ACETAMINOPHEN 5-325 MG PO TABS: 1.0000 | ORAL_TABLET | ORAL | 0 refills | Status: DC | PRN

## 2018-03-25 MED ORDER — 0.9 % SODIUM CHLORIDE (POUR BTL) OPTIME
TOPICAL | Status: DC | PRN
  Administered 2018-03-25: 1000 mL

## 2018-03-25 MED ORDER — DEXAMETHASONE SODIUM PHOSPHATE 10 MG/ML IJ SOLN
INTRAMUSCULAR | Status: AC
  Filled 2018-03-25: qty 1

## 2018-03-25 MED ORDER — LACTATED RINGERS IV SOLN
INTRAVENOUS | Status: DC
  Administered 2018-03-25: 09:00:00 via INTRAVENOUS

## 2018-03-25 MED ORDER — FENTANYL CITRATE (PF) 100 MCG/2ML IJ SOLN
100.0000 ug | Freq: Once | INTRAMUSCULAR | Status: AC
  Administered 2018-03-25: 100 ug via INTRAVENOUS

## 2018-03-25 MED ORDER — MIDAZOLAM HCL 2 MG/2ML IJ SOLN
2.0000 mg | Freq: Once | INTRAMUSCULAR | Status: AC
  Administered 2018-03-25: 2 mg via INTRAVENOUS

## 2018-03-25 MED ORDER — ONDANSETRON HCL 4 MG/2ML IJ SOLN
INTRAMUSCULAR | Status: AC
  Filled 2018-03-25: qty 2

## 2018-03-25 MED ORDER — LIDOCAINE 2% (20 MG/ML) 5 ML SYRINGE
INTRAMUSCULAR | Status: AC
  Filled 2018-03-25: qty 5

## 2018-03-25 MED ORDER — CEFAZOLIN SODIUM-DEXTROSE 2-4 GM/100ML-% IV SOLN
INTRAVENOUS | Status: AC
Start: 2018-03-25 — End: 2018-03-25
  Filled 2018-03-25: qty 100

## 2018-03-25 MED ORDER — EPHEDRINE SULFATE 50 MG/ML IJ SOLN
INTRAMUSCULAR | Status: DC | PRN
  Administered 2018-03-25 (×3): 5 mg via INTRAVENOUS

## 2018-03-25 MED ORDER — LIDOCAINE 2% (20 MG/ML) 5 ML SYRINGE
INTRAMUSCULAR | Status: DC | PRN
  Administered 2018-03-25: 80 mg via INTRAVENOUS

## 2018-03-25 MED ORDER — FENTANYL CITRATE (PF) 250 MCG/5ML IJ SOLN
INTRAMUSCULAR | Status: AC
  Filled 2018-03-25: qty 5

## 2018-03-25 SURGICAL SUPPLY — 47 items
BANDAGE ACE 4X5 VEL STRL LF (GAUZE/BANDAGES/DRESSINGS) ×2 IMPLANT
BANDAGE ESMARK 6X9 LF (GAUZE/BANDAGES/DRESSINGS) IMPLANT
BIT DRILL 2.5X110 QC LCP DISP (BIT) ×2 IMPLANT
BNDG CMPR 9X6 STRL LF SNTH (GAUZE/BANDAGES/DRESSINGS)
BNDG COHESIVE 4X5 TAN STRL (GAUZE/BANDAGES/DRESSINGS) ×3 IMPLANT
BNDG ESMARK 6X9 LF (GAUZE/BANDAGES/DRESSINGS)
BNDG GAUZE ELAST 4 BULKY (GAUZE/BANDAGES/DRESSINGS) ×3 IMPLANT
COVER SURGICAL LIGHT HANDLE (MISCELLANEOUS) ×3 IMPLANT
DRAPE OEC MINIVIEW 54X84 (DRAPES) IMPLANT
DRAPE U-SHAPE 47X51 STRL (DRAPES) ×3 IMPLANT
DRSG ADAPTIC 3X8 NADH LF (GAUZE/BANDAGES/DRESSINGS) ×3 IMPLANT
DRSG PAD ABDOMINAL 8X10 ST (GAUZE/BANDAGES/DRESSINGS) ×3 IMPLANT
DURAPREP 26ML APPLICATOR (WOUND CARE) ×3 IMPLANT
ELECT REM PT RETURN 9FT ADLT (ELECTROSURGICAL) ×3
ELECTRODE REM PT RTRN 9FT ADLT (ELECTROSURGICAL) ×1 IMPLANT
GAUZE SPONGE 4X4 12PLY STRL (GAUZE/BANDAGES/DRESSINGS) ×3 IMPLANT
GLOVE BIOGEL PI IND STRL 9 (GLOVE) ×1 IMPLANT
GLOVE BIOGEL PI INDICATOR 9 (GLOVE) ×2
GLOVE SURG ORTHO 9.0 STRL STRW (GLOVE) ×3 IMPLANT
GOWN STRL REUS W/ TWL XL LVL3 (GOWN DISPOSABLE) ×3 IMPLANT
GOWN STRL REUS W/TWL XL LVL3 (GOWN DISPOSABLE) ×9
KIT BASIN OR (CUSTOM PROCEDURE TRAY) ×3 IMPLANT
KIT TURNOVER KIT B (KITS) ×3 IMPLANT
MANIFOLD NEPTUNE II (INSTRUMENTS) ×3 IMPLANT
NS IRRIG 1000ML POUR BTL (IV SOLUTION) ×3 IMPLANT
PACK ORTHO EXTREMITY (CUSTOM PROCEDURE TRAY) ×3 IMPLANT
PAD ABD 8X10 STRL (GAUZE/BANDAGES/DRESSINGS) ×2 IMPLANT
PAD ARMBOARD 7.5X6 YLW CONV (MISCELLANEOUS) ×6 IMPLANT
PLATE LCP 3.5 1/3 TUB 7HX81 (Plate) ×2 IMPLANT
SCREW CORTEX 3.5 12MM (Screw) ×4 IMPLANT
SCREW CORTEX 3.5 14MM (Screw) ×2 IMPLANT
SCREW CORTEX 3.5 18MM (Screw) ×2 IMPLANT
SCREW LOCK CORT ST 3.5X12 (Screw) IMPLANT
SCREW LOCK CORT ST 3.5X14 (Screw) IMPLANT
SCREW LOCK CORT ST 3.5X18 (Screw) IMPLANT
SCREW LOCK T15 FT 14X3.5X2.9X (Screw) IMPLANT
SCREW LOCKING 3.5X14 (Screw) ×9 IMPLANT
STAPLER VISISTAT 35W (STAPLE) IMPLANT
SUCTION FRAZIER HANDLE 10FR (MISCELLANEOUS) ×2
SUCTION TUBE FRAZIER 10FR DISP (MISCELLANEOUS) ×1 IMPLANT
SUT ETHILON 2 0 PSLX (SUTURE) IMPLANT
SUT VIC AB 2-0 CT1 27 (SUTURE) ×3
SUT VIC AB 2-0 CT1 TAPERPNT 27 (SUTURE) ×1 IMPLANT
TOWEL OR 17X24 6PK STRL BLUE (TOWEL DISPOSABLE) ×3 IMPLANT
TOWEL OR 17X26 10 PK STRL BLUE (TOWEL DISPOSABLE) ×3 IMPLANT
TUBE CONNECTING 12'X1/4 (SUCTIONS) ×1
TUBE CONNECTING 12X1/4 (SUCTIONS) ×2 IMPLANT

## 2018-03-25 NOTE — Op Note (Addendum)
03/25/2018  12:01 PM  PATIENT:  Devin Hodge    PRE-OPERATIVE DIAGNOSIS:  Non-Union Right Ankle  POST-OPERATIVE DIAGNOSIS:  Same  PROCEDURE:  TAKE DOWN NON-UNION OPEN REDUCTION INTERNAL FIXATION (ORIF) RIGHT ANKLE FRACTURE C-arm flourocopy  SURGEON:  Nadara MustardMarcus V Kirk Basquez, MD  PHYSICIAN ASSISTANT:None ANESTHESIA:   General  PREOPERATIVE INDICATIONS:  Devin Hodge is a  47 y.o. male with a diagnosis of Non-Union Right Ankle who failed conservative measures and elected for surgical management.    The risks benefits and alternatives were discussed with the patient preoperatively including but not limited to the risks of infection, bleeding, nerve injury, cardiopulmonary complications, the need for revision surgery, among others, and the patient was willing to proceed.  OPERATIVE IMPLANTS: 7 hole 1/3 tubular plate  @ENCIMAGES @  OPERATIVE FINDINGS: fibrous nonunion  OPERATIVE PROCEDURE: patient was brought to the operating room after undergoing a popliteal block.  Patient underwent general anesthetic.  After adequate levels of anesthesia were obtained patient's right lower extremity was prepped using DuraPrep draped into a sterile field a timeout was called.  A lateral incision was made over the fibula.  This was carried sharply down to bone.  A subperiosteal dissection was performed to debride the callus tissue from the fibrous nonunion.  A curette and osteotome were used to further break up the fibrous nonunion.  With debridement of scar tissue anteriorly and posteriorly the distal fragment was pulled out to length and secured with an interfrag screw.  A neutralization plate was placed laterally with 2 locking screws distally 3 compression screws proximally.  C-arm fluoroscopy verified alignment of the mortise with the fibula out to length.  The wound was irrigated with normal saline incision was closed using 2-0 nylon a sterile dressing was applied.   DISCHARGE PLANNING:  Antibiotic  duration:preoperative  Weightbearing: nonweightbearing right  Pain medication: percocet  Dressing care/ Wound WUJ:WJXBVAC:keep dressing dry  Ambulatory devices:crutches  Discharge to: home  Follow-up: In the office 1 week post operative.

## 2018-03-25 NOTE — Anesthesia Procedure Notes (Signed)
Procedure Name: LMA Insertion Date/Time: 03/25/2018 11:27 AM Performed by: Pearson Grippeobertson, Yusif Gnau M, CRNA Pre-anesthesia Checklist: Patient identified, Emergency Drugs available, Suction available, Patient being monitored and Timeout performed Patient Re-evaluated:Patient Re-evaluated prior to induction Oxygen Delivery Method: Circle system utilized Preoxygenation: Pre-oxygenation with 100% oxygen Induction Type: IV induction Ventilation: Mask ventilation without difficulty LMA: LMA inserted LMA Size: 4.0 Number of attempts: 1 Placement Confirmation: positive ETCO2 and breath sounds checked- equal and bilateral Dental Injury: Teeth and Oropharynx as per pre-operative assessment

## 2018-03-25 NOTE — Anesthesia Preprocedure Evaluation (Signed)
Anesthesia Evaluation  Patient identified by MRN, date of birth, ID band Patient awake    Reviewed: Allergy & Precautions, NPO status , Patient's Chart, lab work & pertinent test results  Airway Mallampati: II  TM Distance: >3 FB Neck ROM: Full    Dental no notable dental hx.    Pulmonary neg pulmonary ROS, Current Smoker,    Pulmonary exam normal breath sounds clear to auscultation       Cardiovascular negative cardio ROS Normal cardiovascular exam Rhythm:Regular Rate:Normal     Neuro/Psych Anxiety Depression negative neurological ROS  negative psych ROS   GI/Hepatic negative GI ROS, Neg liver ROS, GERD  ,  Endo/Other  negative endocrine ROS  Renal/GU negative Renal ROS  negative genitourinary   Musculoskeletal negative musculoskeletal ROS (+)   Abdominal   Peds negative pediatric ROS (+)  Hematology negative hematology ROS (+)   Anesthesia Other Findings   Reproductive/Obstetrics negative OB ROS                             Anesthesia Physical Anesthesia Plan  ASA: II  Anesthesia Plan: General   Post-op Pain Management: GA combined w/ Regional for post-op pain   Induction: Intravenous  PONV Risk Score and Plan: 1 and Ondansetron  Airway Management Planned: Oral ETT  Additional Equipment:   Intra-op Plan:   Post-operative Plan: Extubation in OR  Informed Consent: I have reviewed the patients History and Physical, chart, labs and discussed the procedure including the risks, benefits and alternatives for the proposed anesthesia with the patient or authorized representative who has indicated his/her understanding and acceptance.   Dental advisory given  Plan Discussed with: CRNA  Anesthesia Plan Comments:         Anesthesia Quick Evaluation

## 2018-03-25 NOTE — H&P (Signed)
Devin Hodge is an 47 y.o. male.   Chief Complaint: Right ankle pain. HPI: Patient is a 47 year old gentleman who is status post closed treatment for a displaced Weber B fibular fracture.  Patient has pain with activities of daily living has failed conservative treatment and wished to proceed with surgical intervention.  Past Medical History:  Diagnosis Date  . Anxiety   . Depression   . Drug addiction in remission (HCC)   . GERD (gastroesophageal reflux disease)   . H/O nonunion of fracture    right ankle  . Kidney stone   . PTSD (post-traumatic stress disorder)   . Status post alcohol detoxification     Past Surgical History:  Procedure Laterality Date  . PLEURAL SCARIFICATION      Family History  Problem Relation Age of Onset  . Alcohol abuse Father   . Hypertension Father    Social History:  reports that he has been smoking cigarettes.  He has a 15.00 pack-year smoking history. He has quit using smokeless tobacco. His smokeless tobacco use included snuff. He reports that he drank alcohol. He reports that he has current or past drug history. Drugs: Oxycodone, Marijuana, and Cocaine.  Allergies:  Allergies  Allergen Reactions  . Sulfa Antibiotics Hives    No medications prior to admission.    No results found for this or any previous visit (from the past 48 hour(s)). No results found.  Review of Systems  All other systems reviewed and are negative.   There were no vitals taken for this visit. Physical Exam  Patient has a good dorsalis pedis pulse there is no cellulitis no open wounds no signs of infection.  Radiographs shows a shortened displaced Weber B fibular fracture with a non-congruent mortise and lateral translation of the talus. Assessment/Plan Assessment: Malunion Weber B right fibular fracture.  Plan: We will plan for takedown of the malunion lengthening the fibula open reduction internal fixation.  Risks and benefits were discussed including  persistent arthritis infection DVT need for additional surgery.  Patient states he understands wished to proceed at this time.  Nadara MustardMarcus V Tu Shimmel, MD 03/25/2018, 6:41 AM

## 2018-03-25 NOTE — Anesthesia Postprocedure Evaluation (Signed)
Anesthesia Post Note  Patient: Devin Hodge  Procedure(s) Performed: TAKE DOWN NON-UNION OPEN REDUCTION INTERNAL FIXATION (ORIF) RIGHT ANKLE FRACTURE (Right )     Patient location during evaluation: PACU Anesthesia Type: General Level of consciousness: awake and alert Pain management: pain level controlled Vital Signs Assessment: post-procedure vital signs reviewed and stable Respiratory status: spontaneous breathing, nonlabored ventilation and respiratory function stable Cardiovascular status: blood pressure returned to baseline and stable Postop Assessment: no apparent nausea or vomiting Anesthetic complications: no    Last Vitals:  Vitals:   03/25/18 1225 03/25/18 1236  BP: 120/90 126/86  Pulse: 83 81  Resp: 19 18  Temp: 36.5 C 36.5 C  SpO2: 98% 97%    Last Pain:  Vitals:   03/25/18 1225  TempSrc:   PainSc: 0-No pain                 Lowella CurbWarren Ray Dejion Grillo

## 2018-03-25 NOTE — Anesthesia Procedure Notes (Signed)
Anesthesia Regional Block: Popliteal block   Pre-Anesthetic Checklist: ,, timeout performed, Correct Patient, Correct Site, Correct Laterality, Correct Procedure, Correct Position, site marked, Risks and benefits discussed,  Surgical consent,  Pre-op evaluation,  At surgeon's request and post-op pain management  Laterality: Right  Prep: chloraprep       Needles:  Injection technique: Single-shot  Needle Type: Stimiplex     Needle Length: 9cm  Needle Gauge: 21     Additional Needles:   Procedures:,,,, ultrasound used (permanent image in chart),,,,  Narrative:  Start time: 03/25/2018 9:54 AM End time: 03/25/2018 9:59 AM Injection made incrementally with aspirations every 5 mL.  Performed by: Personally  Anesthesiologist: Lowella CurbMiller, Cesar Alf Ray, MD       20ml 0.5% Ropivacaine added in right adductor canal space in addition to the popliteal block

## 2018-03-25 NOTE — Transfer of Care (Signed)
Immediate Anesthesia Transfer of Care Note  Patient: Devin Hodge  Procedure(s) Performed: TAKE DOWN NON-UNION OPEN REDUCTION INTERNAL FIXATION (ORIF) RIGHT ANKLE FRACTURE (Right )  Patient Location: PACU  Anesthesia Type:GA combined with regional for post-op pain  Level of Consciousness: awake, alert  and oriented  Airway & Oxygen Therapy: Patient Spontanous Breathing and Patient connected to face mask oxygen  Post-op Assessment: Report given to RN and Post -op Vital signs reviewed and stable  Post vital signs: Reviewed and stable  Last Vitals:  Vitals Value Taken Time  BP 126/81 03/25/2018 12:13 PM  Temp 36.5 C 03/25/2018 12:13 PM  Pulse 63 03/25/2018 12:16 PM  Resp 17 03/25/2018 12:16 PM  SpO2 100 % 03/25/2018 12:16 PM  Vitals shown include unvalidated device data.  Last Pain:  Vitals:   03/25/18 1213  TempSrc:   PainSc: Asleep      Patients Stated Pain Goal: 4 (03/25/18 0825)  Complications: No apparent anesthesia complications

## 2018-03-27 ENCOUNTER — Encounter (HOSPITAL_COMMUNITY): Payer: Self-pay | Admitting: Orthopedic Surgery

## 2018-04-01 ENCOUNTER — Telehealth (INDEPENDENT_AMBULATORY_CARE_PROVIDER_SITE_OTHER): Payer: Self-pay | Admitting: Orthopedic Surgery

## 2018-04-01 NOTE — Telephone Encounter (Signed)
S/p take down nonunion ORIF ankle fx 03/25/18. Pt calling for refill on Percocet 5/325 last refill 03/25/18

## 2018-04-01 NOTE — Telephone Encounter (Signed)
Med refill   Oxycodone-acetaminophen( percocet/Roxicet 5-325 mg tablet

## 2018-04-02 ENCOUNTER — Other Ambulatory Visit (INDEPENDENT_AMBULATORY_CARE_PROVIDER_SITE_OTHER): Payer: Self-pay | Admitting: Orthopedic Surgery

## 2018-04-02 MED ORDER — OXYCODONE-ACETAMINOPHEN 5-325 MG PO TABS: 1.0000 | ORAL_TABLET | ORAL | 0 refills | Status: DC | PRN

## 2018-04-02 NOTE — Telephone Encounter (Signed)
rx written

## 2018-04-02 NOTE — Telephone Encounter (Signed)
Pt aware that rx is available for pick up

## 2018-04-08 ENCOUNTER — Ambulatory Visit (INDEPENDENT_AMBULATORY_CARE_PROVIDER_SITE_OTHER): Payer: Self-pay

## 2018-04-08 ENCOUNTER — Ambulatory Visit (INDEPENDENT_AMBULATORY_CARE_PROVIDER_SITE_OTHER): Payer: Self-pay | Admitting: Family

## 2018-04-08 ENCOUNTER — Encounter (INDEPENDENT_AMBULATORY_CARE_PROVIDER_SITE_OTHER): Payer: Self-pay | Admitting: Family

## 2018-04-08 VITALS — Ht 70.0 in | Wt 175.0 lb

## 2018-04-08 DIAGNOSIS — S82899K Other fracture of unspecified lower leg, subsequent encounter for closed fracture with nonunion: Secondary | ICD-10-CM

## 2018-04-08 DIAGNOSIS — S92909K Unspecified fracture of unspecified foot, subsequent encounter for fracture with nonunion: Secondary | ICD-10-CM

## 2018-04-08 MED ORDER — OXYCODONE-ACETAMINOPHEN 5-325 MG PO TABS: 1.0000 | ORAL_TABLET | Freq: Four times a day (QID) | ORAL | 0 refills | Status: DC | PRN

## 2018-04-08 NOTE — Progress Notes (Signed)
Office Visit Note   Patient: Devin Hodge           Date of Birth: [birth year]           MRN: [id number] Visit Date: 04/08/2018              Requested by: Jacquelin HawkingMcElroy, Shannon, PA-C [address] [place], [place] [id number] PCP: Jacquelin HawkingMcElroy, Shannon, PA-C  Chief Complaint  Patient presents with  . Right Ankle - Routine Post Op    03/25/18 take down non union ORIF right ankle fx      HPI: Patient is a 47 year old gentleman who states he broke his ankle on January 13, 2018.  Patient was initially treated in a splint cast and fracture boot.  Patient subsequently followed up with Dr. Romeo [place]Harrison in [place] with a malunion.  Patient is seen today s/p take down nonunion and repair on 03/25/18.  Past medical history significant for smoking and history of hepatitis C.  Assessment & Plan: Visit Diagnoses:  1. 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 Apply dry dressing until healed. Sutures harvested today. Continue nonweight bearing. Continue CAM.  Follow-Up Instructions: No follow-ups on file.   Ortho Exam  Patient is alert, oriented, no adenopathy, well-dressed, normal affect, normal respiratory effort. Examination patient does have swelling around the right ankle he has good hair growth on his foot he has a palpable dorsalis pedis and posterior tibial pulse.  Incision well approximated with sutures. Well healed. No erythema or warmth. No sign of infection. Does have slight redness to suture portals, irritiation from sutures. Imaging: No results found. No images are attached to the encounter.  Labs: Lab Results  Component Value Date   REPTSTATUS 10/22/2011 FINAL 10/21/2011   GRAMSTAIN  03/09/2008    RARE WBC PRESENT, PREDOMINANTLY PMN NO SQUAMOUS EPITHELIAL CELLS SEEN RARE GRAM POSITIVE COCCI IN PAIRS   CULT INSIGNIFICANT GROWTH 10/21/2011   LABORGA METHICILLIN RESISTANT STAPHYLOCOCCUS AUREUS 03/09/2008     Lab Results  Component Value  Date   ALBUMIN 3.7 03/25/2018   ALBUMIN 3.9 02/10/2018   ALBUMIN 3.8 10/14/2016    Body mass index is 25.11 kg/m.  Orders:  Orders Placed This Encounter  Procedures  . XR Ankle Complete Right   Meds ordered this encounter  Medications  . oxyCODONE-acetaminophen (PERCOCET/ROXICET) 5-325 MG tablet    Sig: Take 1 tablet by mouth every 6 (six) hours as needed.    Dispense:  28 tablet    Refill:  0     Procedures: No procedures performed  Clinical Data: No additional findings.  ROS:  All other systems negative, except as noted in the HPI. Review of Systems  Constitutional: Negative for chills and fever.    Objective: Vital Signs: Ht 5\' 10"  (1.778 m)   Wt 175 lb (79.4 kg)   BMI 25.11 kg/m   Specialty Comments:  No specialty comments available.  PMFS History: Patient Active Problem List   Diagnosis Date Noted  . 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 Cocaine abuse with cocaine-induced mood disorder (HCC) 10/15/2016  . Cocaine abuse (HCC) 02/23/2013  . Narcotic abuse (HCC) 02/23/2013  . Alcohol dependence (HCC) 05/26/2012  . Opioid dependence (HCC) 05/26/2012   Past Medical History:  Diagnosis Date  . Anxiety   . Depression   . Drug addiction in remission (  HCC)   . GERD (gastroesophageal reflux disease)   . H/O nonunion of fracture    right ankle  . Kidney stone   . PTSD (post-traumatic stress disorder)   . Status post alcohol detoxification     Family History  Problem Relation Age of Onset  . Alcohol abuse Father   . Hypertension Father     Past Surgical History:  Procedure Laterality Date  . ORIF ANKLE FRACTURE Right 03/25/2018   Procedure: TAKE DOWN NON-UNION OPEN REDUCTION INTERNAL FIXATION (ORIF) RIGHT ANKLE FRACTURE;  Surgeon: Nadara Mustard, MD;  Location: [facility] OR;  Service: Orthopedics;  Laterality: Right;  . PLEURAL SCARIFICATION      Social History   Occupational History  . Not on file  Tobacco Use  . Smoking status: Current Every Day Smoker    Packs/day: 0.50    Years: 30.00    Pack years: 15.00    Types: Cigarettes  . Smokeless tobacco: Former Neurosurgeon    Types: Snuff  Substance and Sexual Activity  . Alcohol use: Not Currently  . Drug use: Not Currently    Types: Oxycodone, Marijuana, Cocaine    Comment: has not used in over a year pet patient- 03/25/18  . Sexual activity: Yes    Birth control/protection: None

## 2018-04-13 ENCOUNTER — Telehealth (INDEPENDENT_AMBULATORY_CARE_PROVIDER_SITE_OTHER): Payer: Self-pay | Admitting: Orthopedic Surgery

## 2018-04-13 NOTE — Telephone Encounter (Signed)
Ok refill percocet

## 2018-04-13 NOTE — Telephone Encounter (Signed)
Patient requesting rx refill on oxycodone. Patients # [phone]

## 2018-04-13 NOTE — Telephone Encounter (Signed)
Please advise 

## 2018-04-14 MED ORDER — OXYCODONE-ACETAMINOPHEN 5-325 MG PO TABS: 1.0000 | ORAL_TABLET | Freq: Four times a day (QID) | ORAL | 0 refills | Status: DC | PRN

## 2018-04-14 NOTE — Telephone Encounter (Signed)
Printed. I left voicemail for patient advising.

## 2018-04-22 ENCOUNTER — Ambulatory Visit (INDEPENDENT_AMBULATORY_CARE_PROVIDER_SITE_OTHER): Payer: Self-pay

## 2018-04-22 ENCOUNTER — Ambulatory Visit (INDEPENDENT_AMBULATORY_CARE_PROVIDER_SITE_OTHER): Payer: Self-pay | Admitting: Orthopedic Surgery

## 2018-04-22 ENCOUNTER — Encounter (INDEPENDENT_AMBULATORY_CARE_PROVIDER_SITE_OTHER): Payer: Self-pay | Admitting: Orthopedic Surgery

## 2018-04-22 DIAGNOSIS — S82899K Other fracture of unspecified lower leg, subsequent encounter for closed fracture with nonunion: Secondary | ICD-10-CM

## 2018-04-22 DIAGNOSIS — S92909K Unspecified fracture of unspecified foot, subsequent encounter for fracture with nonunion: Secondary | ICD-10-CM

## 2018-04-22 DIAGNOSIS — S8261XP Displaced fracture of lateral malleolus of right fibula, subsequent encounter for closed fracture with malunion: Secondary | ICD-10-CM

## 2018-04-22 MED ORDER — OXYCODONE-ACETAMINOPHEN 5-325 MG PO TABS: 1.0000 | ORAL_TABLET | Freq: Three times a day (TID) | ORAL | 0 refills | Status: DC | PRN

## 2018-04-22 MED ORDER — DOXYCYCLINE HYCLATE 100 MG PO TABS: 100.0000 mg | ORAL_TABLET | Freq: Two times a day (BID) | ORAL | 0 refills | Status: DC

## 2018-04-22 NOTE — Progress Notes (Signed)
Office Visit Note   Patient: Devin Hodge           Date of Birth: 1970/12/11           MRN: [id number] Visit Date: 04/22/2018              Requested by: Jacquelin HawkingMcElroy, Shannon, PA-C [address] [place], [place] [address] PCP: Jacquelin HawkingMcElroy, Shannon, PA-C  Chief Complaint  Patient presents with  . Right Ankle - Fracture, Wound Check      HPI: Patient is a 47 year old gentleman who is 4 weeks status post takedown of nonunion bringing the fibula out to length with internal fixation.  Patient states he has a little bit of redness and tenderness laterally.  Assessment & Plan: Visit Diagnoses:  1. 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  Follow-up in 2 weeks at which time anticipate he can be released without restrictions.  Follow-Up Instructions: No follow-ups on file.   Ortho Exam  Patient is alert, oriented, no adenopathy, well-dressed, normal affect, normal respiratory effort. Examination the skin is healed well he has a little area of redness approximately 1 cm in diameter there is no fluctuance no abscess no ascending cellulitis.  Concern for cellulitis.  Imaging: No results found. No images are attached to the encounter.  Labs: Lab Results  Component Value Date   REPTSTATUS 10/22/2011 FINAL 10/21/2011   GRAMSTAIN  03/09/2008    RARE WBC PRESENT, PREDOMINANTLY PMN NO SQUAMOUS EPITHELIAL CELLS SEEN RARE GRAM POSITIVE COCCI IN PAIRS   CULT INSIGNIFICANT GROWTH 10/21/2011   LABORGA METHICILLIN RESISTANT STAPHYLOCOCCUS AUREUS 03/09/2008     Lab Results  Component Value Date   ALBUMIN 3.7 03/25/2018   ALBUMIN 3.9 02/10/2018   ALBUMIN 3.8 10/14/2016    There is no height or weight on file to calculate BMI.  Orders:  Orders Placed This Encounter  Procedures  . XR Ankle 2 Views Right   Meds ordered this encounter  Medications  . doxycycline (VIBRA-TABS) 100 MG tablet    Sig: Take 1 tablet  (100 mg total) by mouth 2 (two) times daily.    Dispense:  20 tablet    Refill:  0  . oxyCODONE-acetaminophen (PERCOCET/ROXICET) 5-325 MG tablet    Sig: Take 1 tablet by mouth every 8 (eight) hours as needed.    Dispense:  20 tablet    Refill:  0     Procedures: No procedures performed  Clinical Data: No additional findings.  ROS:  All other systems negative, except as noted in the HPI. Review of Systems  Objective: Vital Signs: There were no vitals taken for this visit.  Specialty Comments:  No specialty comments available.  PMFS History: Patient Active Problem List   Diagnosis Date Noted  . 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 Cocaine abuse with cocaine-induced mood disorder (HCC) 10/15/2016  . Cocaine abuse (HCC) 02/23/2013  . Narcotic abuse (HCC) 02/23/2013  . Alcohol dependence (HCC) 05/26/2012  . Opioid dependence (HCC) 05/26/2012   Past Medical History:  Diagnosis Date  . Anxiety   . Depression   . Drug addiction in remission (HCC)   . GERD (gastroesophageal reflux disease)   . H/O nonunion of fracture    right ankle  . Kidney stone   . PTSD (post-traumatic stress disorder)   .  Status post alcohol detoxification     Family History  Problem Relation Age of Onset  . Alcohol abuse Father   . Hypertension Father     Past Surgical History:  Procedure Laterality Date  . ORIF ANKLE FRACTURE Right 03/25/2018   Procedure: TAKE DOWN NON-UNION OPEN REDUCTION INTERNAL FIXATION (ORIF) RIGHT ANKLE FRACTURE;  Surgeon: Nadara Mustard, MD;  Location: [facility] OR;  Service: Orthopedics;  Laterality: Right;  . PLEURAL SCARIFICATION     Social History   Occupational History  . Not on file  Tobacco Use  . Smoking status: Current Every Day Smoker    Packs/day: 0.50    Years: 30.00    Pack years: 15.00    Types: Cigarettes  . Smokeless tobacco: Former Neurosurgeon     Types: Snuff  Substance and Sexual Activity  . Alcohol use: Not Currently  . Drug use: Not Currently    Types: Oxycodone, Marijuana, Cocaine    Comment: has not used in over a year pet patient- 03/25/18  . Sexual activity: Yes    Birth control/protection: None

## 2018-04-28 ENCOUNTER — Telehealth (INDEPENDENT_AMBULATORY_CARE_PROVIDER_SITE_OTHER): Payer: Self-pay | Admitting: Orthopedic Surgery

## 2018-04-28 NOTE — Telephone Encounter (Signed)
Patient left a message requesting an RX refill on his Oxycodone.  He stated that he will be out of them tomorrow.  CB#[phone].  Thank you.

## 2018-04-28 NOTE — Telephone Encounter (Signed)
Pt is s/p a take down non union internal fixation right ankle. He is wanting rx for Percocet. He has had 164 tabs in the last 4 weeks with the last rx having been given on 04/22/18 please advise.

## 2018-04-28 NOTE — Telephone Encounter (Signed)
I called pt and advised of message below. He states that he needs to have the medication I advised that if he is having pain that we need to have him come in for eval and make sure that surgical site is healing and intact. The pt states " no I don't want that but thanks anyway"

## 2018-04-28 NOTE — Telephone Encounter (Signed)
You call to soon for refill.

## 2018-05-01 ENCOUNTER — Telehealth (INDEPENDENT_AMBULATORY_CARE_PROVIDER_SITE_OTHER): Payer: Self-pay | Admitting: Orthopedic Surgery

## 2018-05-01 NOTE — Telephone Encounter (Signed)
Patient called this morning requesting an RX refill on his Oxycodone.  CB#[phone].  Thank you.

## 2018-05-01 NOTE — Telephone Encounter (Signed)
Rx request 

## 2018-05-04 ENCOUNTER — Other Ambulatory Visit (INDEPENDENT_AMBULATORY_CARE_PROVIDER_SITE_OTHER): Payer: Self-pay | Admitting: Orthopedic Surgery

## 2018-05-04 MED ORDER — OXYCODONE-ACETAMINOPHEN 5-325 MG PO TABS: 1.0000 | ORAL_TABLET | Freq: Four times a day (QID) | ORAL | 0 refills | Status: DC | PRN

## 2018-05-04 NOTE — Telephone Encounter (Signed)
Called pt to advise that rx is ready for pick up at the front desk.  

## 2018-05-04 NOTE — Telephone Encounter (Signed)
rx written

## 2018-05-06 ENCOUNTER — Ambulatory Visit (INDEPENDENT_AMBULATORY_CARE_PROVIDER_SITE_OTHER): Payer: Self-pay | Admitting: Orthopedic Surgery

## 2018-05-06 ENCOUNTER — Ambulatory Visit (INDEPENDENT_AMBULATORY_CARE_PROVIDER_SITE_OTHER): Payer: Self-pay | Admitting: Family

## 2018-05-12 ENCOUNTER — Telehealth (INDEPENDENT_AMBULATORY_CARE_PROVIDER_SITE_OTHER): Payer: Self-pay | Admitting: Orthopedic Surgery

## 2018-05-12 ENCOUNTER — Other Ambulatory Visit (INDEPENDENT_AMBULATORY_CARE_PROVIDER_SITE_OTHER): Payer: Self-pay | Admitting: Orthopedic Surgery

## 2018-05-12 MED ORDER — OXYCODONE-ACETAMINOPHEN 5-325 MG PO TABS: 1.0000 | ORAL_TABLET | Freq: Three times a day (TID) | ORAL | 0 refills | Status: DC | PRN

## 2018-05-12 NOTE — Telephone Encounter (Signed)
03/25/18 take down nonunion ORIF ankle. Request refill on percocet 5/325. Last refil was 05/04/18 #28

## 2018-05-12 NOTE — Telephone Encounter (Signed)
Medication Refill  Oxycodone   Please call patient when med is ready for pick up

## 2018-05-12 NOTE — Telephone Encounter (Signed)
rx written

## 2018-05-13 ENCOUNTER — Encounter (INDEPENDENT_AMBULATORY_CARE_PROVIDER_SITE_OTHER): Payer: Self-pay | Admitting: Family

## 2018-05-13 ENCOUNTER — Ambulatory Visit (INDEPENDENT_AMBULATORY_CARE_PROVIDER_SITE_OTHER): Payer: Self-pay

## 2018-05-13 ENCOUNTER — Ambulatory Visit (INDEPENDENT_AMBULATORY_CARE_PROVIDER_SITE_OTHER): Payer: Self-pay | Admitting: Orthopedic Surgery

## 2018-05-13 VITALS — Ht 70.0 in | Wt 175.0 lb

## 2018-05-13 DIAGNOSIS — S82899K Other fracture of unspecified lower leg, subsequent encounter for closed fracture with nonunion: Secondary | ICD-10-CM

## 2018-05-13 DIAGNOSIS — S92909K Unspecified fracture of unspecified foot, subsequent encounter for fracture with nonunion: Secondary | ICD-10-CM

## 2018-05-13 NOTE — Telephone Encounter (Signed)
I called number provided and pt's girlfriend states that she is at the beach and the pt is at home. He has an appt today and will give rx at that time.

## 2018-05-14 ENCOUNTER — Encounter (INDEPENDENT_AMBULATORY_CARE_PROVIDER_SITE_OTHER): Payer: Self-pay | Admitting: Orthopedic Surgery

## 2018-05-14 NOTE — Progress Notes (Signed)
Office Visit Note   Patient: Devin Hodge           Date of Birth: 21-Aug-1971           MRN: [id number] Visit Date: 05/13/2018              Requested by: Jacquelin Hawking, PA-C [address] [place], [place] [address] PCP: Jacquelin Hawking, PA-C  Chief Complaint  Patient presents with  . Right Ankle - Routine Post Op    03/25/18 take down nonunion ORIF right ankle       HPI: Patient is 7 weeks status post takedown nonunion of the right fibula bringing the fibula out to length with internal fixation.  Patient is completed his course of doxycycline.  Assessment & Plan: Visit Diagnoses:  1. 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  Follow-Up Instructions: Return if symptoms worsen or fail to improve.   Ortho Exam  Patient is alert, oriented, no adenopathy, well-dressed, normal affect, normal respiratory effort. Patient has resolution of the cellulitis dermatitis.  He is currently in regular shoewear he states he does have swelling at the end of the day which resolves with elevation.  He notes some stiffness and tenderness at the end of the day as well.  Patient will continue to increase his activities range of motion of the ankle scar massage.  Imaging: Xr Ankle Complete Right  Result Date: 05/14/2018 3 view radiographs of the right ankle shows the fibula out to length no complicating features no hardware of the bone has healed well.  No images are attached to the encounter.  Labs: Lab Results  Component Value Date   REPTSTATUS 10/22/2011 FINAL 10/21/2011   GRAMSTAIN  03/09/2008    RARE WBC PRESENT, PREDOMINANTLY PMN NO SQUAMOUS EPITHELIAL CELLS SEEN RARE GRAM POSITIVE COCCI IN PAIRS   CULT INSIGNIFICANT GROWTH 10/21/2011   LABORGA METHICILLIN RESISTANT STAPHYLOCOCCUS AUREUS 03/09/2008     Lab Results  Component Value Date   ALBUMIN 3.7 03/25/2018   ALBUMIN 3.9  02/10/2018   ALBUMIN 3.8 10/14/2016    Body mass index is 25.11 kg/m.  Orders:  Orders Placed This Encounter  Procedures  . XR Ankle Complete Right   No orders of the defined types were placed in this encounter.    Procedures: No procedures performed  Clinical Data: No additional findings.  ROS:  All other systems negative, except as noted in the HPI. Review of Systems  Objective: Vital Signs: Ht 5\' 10"  (1.778 m)   Wt 175 lb (79.4 kg)   BMI 25.11 kg/m   Specialty Comments:  No specialty comments available.  PMFS History: Patient Active Problem List   Diagnosis Date Noted  . 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 Cocaine abuse with cocaine-induced mood disorder (HCC) 10/15/2016  . Cocaine abuse (HCC) 02/23/2013  . Narcotic abuse (HCC) 02/23/2013  . Alcohol dependence (HCC) 05/26/2012  . Opioid dependence (HCC) 05/26/2012   Past Medical History:  Diagnosis Date  . Anxiety   . Depression   . Drug addiction in remission (HCC)   . GERD (gastroesophageal reflux disease)   . H/O nonunion of fracture    right ankle  . Kidney stone   . PTSD (post-traumatic stress disorder)   . Status post alcohol detoxification  Family History  Problem Relation Age of Onset  . Alcohol abuse Father   . Hypertension Father     Past Surgical History:  Procedure Laterality Date  . ORIF ANKLE FRACTURE Right 03/25/2018   Procedure: TAKE DOWN NON-UNION OPEN REDUCTION INTERNAL FIXATION (ORIF) RIGHT ANKLE FRACTURE;  Surgeon: Nadara Mustarduda, Marcus V, MD;  Location: [facility] OR;  Service: Orthopedics;  Laterality: Right;  . PLEURAL SCARIFICATION     Social History   Occupational History  . Not on file  Tobacco Use  . Smoking status: Current Every Day Smoker    Packs/day: 0.50    Years: 30.00    Pack years: 15.00    Types: Cigarettes  . Smokeless tobacco: Former NeurosurgeonUser     Types: Snuff  Substance and Sexual Activity  . Alcohol use: Not Currently  . Drug use: Not Currently    Types: Oxycodone, Marijuana, Cocaine    Comment: has not used in over a year pet patient- 03/25/18  . Sexual activity: Yes    Birth control/protection: None

## 2018-06-12 ENCOUNTER — Ambulatory Visit: Payer: Self-pay | Admitting: Gastroenterology

## 2018-09-09 ENCOUNTER — Encounter: Payer: Self-pay | Admitting: Physician Assistant

## 2018-09-09 ENCOUNTER — Ambulatory Visit: Payer: Self-pay | Admitting: Physician Assistant

## 2018-09-09 VITALS — BP 100/76 | HR 72 | Temp 97.7°F | Ht 70.0 in | Wt 185.0 lb

## 2018-09-09 DIAGNOSIS — B182 Chronic viral hepatitis C: Secondary | ICD-10-CM

## 2018-09-09 DIAGNOSIS — R945 Abnormal results of liver function studies: Secondary | ICD-10-CM

## 2018-09-09 DIAGNOSIS — F17200 Nicotine dependence, unspecified, uncomplicated: Secondary | ICD-10-CM

## 2018-09-09 DIAGNOSIS — R7989 Other specified abnormal findings of blood chemistry: Secondary | ICD-10-CM

## 2018-09-09 NOTE — Progress Notes (Signed)
BP 100/76 (BP Location: Left Arm, Patient Position: Sitting, Cuff Size: Normal)   Pulse 72   Temp 97.7 F (36.5 C)   Ht 5\' 10"  (1.778 m)   Wt 185 lb (83.9 kg)   SpO2 98%   BMI 26.54 kg/m    Subjective:    Patient ID: Devin Hodge, male    DOB: 1971/04/29, 47 y.o.   MRN: [id number]  HPI: Devin QuayWoodrow W Dancel is a 47 y.o. male presenting on 09/09/2018 for Follow-up   HPI  Pt had to go back in jail again and just got out on December 4.  Pt has seen orthopedist.  He did not schedule to see GI for hepatitis.  He was referred at his last OV here.  He says he turned in his [facility] [employer] [facility] application but didn't ever hear if he got approved  He is still smoking  Relevant past medical, surgical, family and social history reviewed and updated as indicated. Interim medical history since our last visit reviewed. Allergies and medications reviewed and updated.  No current outpatient medications on file.    Review of Systems  Constitutional: Negative for appetite change, chills, diaphoresis, fatigue, fever and unexpected weight change.  HENT: Positive for congestion, dental problem and hearing loss. Negative for drooling, ear pain, facial swelling, mouth sores, sneezing, sore throat, trouble swallowing and voice change.   Eyes: Positive for visual disturbance. Negative for pain, discharge, redness and itching.  Respiratory: Negative for cough, choking, shortness of breath and wheezing.   Cardiovascular: Negative for chest pain, palpitations and leg swelling.  Gastrointestinal: Negative for abdominal pain, blood in stool, constipation, diarrhea and vomiting.  Endocrine: Negative for cold intolerance, heat intolerance and polydipsia.  Genitourinary: Negative for decreased urine volume, dysuria and hematuria.  Musculoskeletal: Positive for arthralgias and gait problem. Negative for back pain.  Skin: Negative for rash.  Allergic/Immunologic: Negative for environmental allergies.   Neurological: Negative for seizures, syncope, light-headedness and headaches.  Hematological: Negative for adenopathy.  Psychiatric/Behavioral: Positive for dysphoric mood. Negative for agitation and suicidal ideas. The patient is nervous/anxious.     Per HPI unless specifically indicated above     Objective:    BP 100/76 (BP Location: Left Arm, Patient Position: Sitting, Cuff Size: Normal)   Pulse 72   Temp 97.7 F (36.5 C)   Ht 5\' 10"  (1.778 m)   Wt 185 lb (83.9 kg)   SpO2 98%   BMI 26.54 kg/m   Wt Readings from Last 3 Encounters:  09/09/18 185 lb (83.9 kg)  05/13/18 175 lb (79.4 kg)  04/08/18 175 lb (79.4 kg)    Physical Exam Vitals signs reviewed.  Constitutional:      Appearance: He is well-developed.  HENT:     Head: Normocephalic and atraumatic.  Neck:     Musculoskeletal: Neck supple.  Cardiovascular:     Rate and Rhythm: Normal rate and regular rhythm.  Pulmonary:     Effort: Pulmonary effort is normal.     Breath sounds: Normal breath sounds. No wheezing.  Abdominal:     General: Bowel sounds are normal.     Palpations: Abdomen is soft.     Tenderness: There is no abdominal tenderness.  Lymphadenopathy:     Cervical: No cervical adenopathy.  Skin:    General: Skin is warm and dry.  Neurological:     Mental Status: He is alert and oriented to person, place, and time.  Psychiatric:        Behavior:  Behavior normal.         Assessment & Plan:    Encounter Diagnoses  Name Primary?  . Chronic hepatitis C without hepatic coma (HCC) Yes  . Elevated serum creatinine   . Elevated LFTs   . Tobacco use disorder      -pt is given contact information for financial counselor to check on his application for charity care -will check cmp to check kidney and liver function -counseled smoking cessation -will re-refer to GI for hepatitis -pt to follow up in 6 months.  RTO sooner prn

## 2018-09-09 NOTE — Patient Instructions (Signed)
Financial Counselor- [phone]  

## 2018-10-05 ENCOUNTER — Encounter (INDEPENDENT_AMBULATORY_CARE_PROVIDER_SITE_OTHER): Payer: Self-pay | Admitting: Internal Medicine

## 2019-02-25 ENCOUNTER — Other Ambulatory Visit: Payer: Self-pay | Admitting: Physician Assistant

## 2019-02-25 DIAGNOSIS — B182 Chronic viral hepatitis C: Secondary | ICD-10-CM

## 2019-02-25 DIAGNOSIS — R7989 Other specified abnormal findings of blood chemistry: Secondary | ICD-10-CM

## 2019-02-25 NOTE — Progress Notes (Unsigned)
mp

## 2019-03-11 ENCOUNTER — Ambulatory Visit: Payer: Self-pay | Admitting: Physician Assistant

## 2019-04-17 ENCOUNTER — Other Ambulatory Visit: Payer: Self-pay

## 2019-04-17 ENCOUNTER — Emergency Department (HOSPITAL_COMMUNITY)
Admission: EM | Admit: 2019-04-17 | Discharge: 2019-04-17 | Disposition: A | Discharge status: Home / Self Care | Payer: Self-pay | Attending: Emergency Medicine | Admitting: Emergency Medicine

## 2019-04-17 ENCOUNTER — Emergency Department (HOSPITAL_COMMUNITY): Payer: Self-pay

## 2019-04-17 ENCOUNTER — Encounter (HOSPITAL_COMMUNITY): Payer: Self-pay | Admitting: Emergency Medicine

## 2019-04-17 DIAGNOSIS — F17210 Nicotine dependence, cigarettes, uncomplicated: Secondary | ICD-10-CM | POA: Insufficient documentation

## 2019-04-17 DIAGNOSIS — M25571 Pain in right ankle and joints of right foot: Secondary | ICD-10-CM | POA: Insufficient documentation

## 2019-04-17 DIAGNOSIS — R2241 Localized swelling, mass and lump, right lower limb: Secondary | ICD-10-CM | POA: Insufficient documentation

## 2019-04-17 MED ORDER — IBUPROFEN 400 MG PO TABS
600.0000 mg | ORAL_TABLET | Freq: Once | ORAL | Status: AC
  Administered 2019-04-17: 600 mg via ORAL
  Filled 2019-04-17: qty 2

## 2019-04-17 NOTE — ED Notes (Signed)
Computer program cut off in midst of scanning pt's medication.

## 2019-04-17 NOTE — Discharge Instructions (Signed)
You were seen in the ED today  °

## 2019-04-17 NOTE — ED Triage Notes (Signed)
Patient states right ankle pain. Patient had surgery on his right ankle x 1 year ago. Patient states he has been working but no known injury to his ankle recently. Patient states shooting pains from ankle up to his leg.

## 2019-04-17 NOTE — ED Provider Notes (Signed)
[facility] EMERGENCY DEPARTMENT Provider Note   CSN: [id number] Arrival date & time: 04/17/19  2045    History   Chief Complaint Chief Complaint  Patient presents with  . Ankle Pain    right ankle    HPI Devin Hodge is a 48 y.o. male with PMhx anxiety, depression, polysubstance abuse, GERD who presents to the ED complaining of gradual onset, constant, achy, right ankle pain x 2 days. Pt reports about 1 year ago he had surgery after breaking the fibula and is concerned something could be out of place. No recent injury that patient can think of. He endorses mild swelling to the ankle as well. Denies fever, chills, weakness, numbness, color change, or any other associated symptoms. No recent prolonged travel or immobilization. No hx DVT/PE. No hemoptysis.        Past Medical History:  Diagnosis Date  . Anxiety   . Depression   . Drug addiction in remission (HCC)   . GERD (gastroesophageal reflux disease)   . H/O nonunion of fracture    right ankle  . Kidney stone   . PTSD (post-traumatic stress disorder)   . Status post alcohol detoxification     Patient Active Problem List   Diagnosis Date Noted  . 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 Cocaine abuse with cocaine-induced mood disorder (HCC) 10/15/2016  . Cocaine abuse (HCC) 02/23/2013  . Narcotic abuse (HCC) 02/23/2013  . Alcohol dependence (HCC) 05/26/2012  . Opioid dependence (HCC) 05/26/2012    Past Surgical History:  Procedure Laterality Date  . ORIF ANKLE FRACTURE Right 03/25/2018   Procedure: TAKE DOWN NON-UNION OPEN REDUCTION INTERNAL FIXATION (ORIF) RIGHT ANKLE FRACTURE;  Surgeon: Nadara Mustarduda, Marcus V, MD;  Location: [facility] OR;  Service: Orthopedics;  Laterality: Right;  . PLEURAL SCARIFICATION          Home Medications    Prior to Admission medications   Not on File    Family History Family  History  Problem Relation Age of Onset  . Alcohol abuse Father   . Hypertension Father     Social History Social History   Tobacco Use  . Smoking status: Current Every Day Smoker    Packs/day: 0.50    Years: 30.00    Pack years: 15.00    Types: Cigarettes  . Smokeless tobacco: Former NeurosurgeonUser    Types: Snuff  Substance Use Topics  . Alcohol use: Not Currently  . Drug use: Not Currently    Types: Oxycodone, Marijuana, Cocaine    Comment: has not used in over a year pet patient- 03/25/18     Allergies   Sulfa antibiotics   Review of Systems Review of Systems  Constitutional: Negative for chills and fever.  Musculoskeletal: Positive for arthralgias and joint swelling.  Skin: Negative for color change and wound.  Neurological: Negative for weakness and numbness.     Physical Exam Updated Vital Signs BP (!) 129/96 (BP Location: Left Arm)   Pulse 87   Temp 98.3 F (36.8 C) (Oral)   Resp 20   Ht 6' (1.829 m)   Wt 77.1 kg   SpO2 97%   BMI 23.06 kg/m   Physical Exam Vitals signs and nursing note reviewed.  Constitutional:      Appearance: He is not ill-appearing.  HENT:     Head: Normocephalic and atraumatic.  Eyes:  Conjunctiva/sclera: Conjunctivae normal.  Cardiovascular:     Rate and Rhythm: Normal rate and regular rhythm.  Pulmonary:     Effort: Pulmonary effort is normal.     Breath sounds: Normal breath sounds.  Musculoskeletal:     Comments: No obvious swelling noted to right ankle compared to left. Well healed incision to lateral aspect of distal leg. Mild tenderness to lateral malleolus with palpation. No calf tenderness. Full ROM intact. Strength and sensation intact. Good distal pulses.   Skin:    General: Skin is warm and dry.     Coloration: Skin is not jaundiced.  Neurological:     Mental Status: He is alert.      ED Treatments / Results  Labs (all labs ordered are listed, but only abnormal results are displayed) Labs Reviewed - No data  to display  EKG None  Radiology Dg Ankle Complete Right  Result Date: 04/17/2019 CLINICAL DATA:  Pain EXAM: RIGHT ANKLE - COMPLETE 3+ VIEW COMPARISON:  May 13, 2018 FINDINGS: The patient is status post prior plate and screw fixation of the distal fibula. The hardware appears grossly intact. There are small well corticated osseous fragments adjacent to the distal medial malleolus, likely related to an old remote injury. IMPRESSION: No acute osseous abnormality. Electronically Signed   By: Constance Holster M.D.   On: 04/17/2019 21:31    Procedures Procedures (including critical care time)  Medications Ordered in ED Medications  ibuprofen (ADVIL) tablet 600 mg (has no administration in time range)     Initial Impression / Assessment and Plan / ED Course  I have reviewed the triage vital signs and the nursing notes.  Pertinent labs & imaging results that were available during my care of the patient were reviewed by me and considered in my medical decision making (see chart for details).    48 year old male who presents to the ED today with right ankle pain. He has hx of fixation from fibular fracture done 1 year ago by Dr.Duda after breaking it while in jail. No recent injury. Pt endorses swelling although none appreciated on my exam. Patient was able to ambulate to the ED from near the [facility] without difficulty. He has not taken anything for pain. No DVT risk factors today. No calf tenderness on exam. Xray obtained of the right ankle without obvious changes. Advised patient to take Ibuprofen PRN for pain and to rest, ice, and elevate leg for comfort. He reports he has an appointment with Dr. Sharol Given scheduled for Monday which I feel is very appropriate. Advised to keep. Pt stable for discharge at this time.        Final Clinical Impressions(s) / ED Diagnoses   Final diagnoses:  Acute right ankle pain    ED Discharge Orders    None       Eustaquio Maize, Hershal Coria 04/17/19 2222     Noemi Chapel, MD 04/21/19 0800

## 2019-04-19 ENCOUNTER — Ambulatory Visit: Payer: Self-pay | Admitting: Orthopedic Surgery

## 2019-08-19 ENCOUNTER — Emergency Department (HOSPITAL_COMMUNITY): Payer: Self-pay

## 2019-08-19 ENCOUNTER — Emergency Department (HOSPITAL_COMMUNITY)
Admission: EM | Admit: 2019-08-19 | Discharge: 2019-08-19 | Disposition: A | Discharge status: Home / Self Care | Payer: Self-pay | Attending: Emergency Medicine | Admitting: Emergency Medicine

## 2019-08-19 ENCOUNTER — Encounter (HOSPITAL_COMMUNITY): Payer: Self-pay | Admitting: *Deleted

## 2019-08-19 DIAGNOSIS — M79605 Pain in left leg: Secondary | ICD-10-CM | POA: Insufficient documentation

## 2019-08-19 DIAGNOSIS — Y999 Unspecified external cause status: Secondary | ICD-10-CM | POA: Insufficient documentation

## 2019-08-19 DIAGNOSIS — Y929 Unspecified place or not applicable: Secondary | ICD-10-CM | POA: Insufficient documentation

## 2019-08-19 DIAGNOSIS — M25562 Pain in left knee: Secondary | ICD-10-CM | POA: Insufficient documentation

## 2019-08-19 DIAGNOSIS — Z5321 Procedure and treatment not carried out due to patient leaving prior to being seen by health care provider: Secondary | ICD-10-CM | POA: Insufficient documentation

## 2019-08-19 DIAGNOSIS — Y939 Activity, unspecified: Secondary | ICD-10-CM | POA: Insufficient documentation
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
## 2019-08-19 NOTE — ED Notes (Signed)
Not in waiting area.  

## 2019-10-13 ENCOUNTER — Emergency Department (HOSPITAL_COMMUNITY)
Admission: EM | Admit: 2019-10-13 | Discharge: 2019-10-13 | Disposition: A | Discharge status: Home / Self Care | Payer: Self-pay | Attending: Emergency Medicine | Admitting: Emergency Medicine

## 2019-10-13 ENCOUNTER — Other Ambulatory Visit: Payer: Self-pay

## 2019-10-13 ENCOUNTER — Emergency Department (HOSPITAL_COMMUNITY): Payer: Self-pay

## 2019-10-13 ENCOUNTER — Encounter (HOSPITAL_COMMUNITY): Payer: Self-pay | Admitting: *Deleted

## 2019-10-13 DIAGNOSIS — F17210 Nicotine dependence, cigarettes, uncomplicated: Secondary | ICD-10-CM | POA: Insufficient documentation

## 2019-10-13 DIAGNOSIS — N200 Calculus of kidney: Secondary | ICD-10-CM | POA: Insufficient documentation

## 2019-10-13 LAB — CBC WITH DIFFERENTIAL/PLATELET
Abs Immature Granulocytes: 0.03 10*3/uL (ref 0.00–0.07)
Basophils Absolute: 0.1 10*3/uL (ref 0.0–0.1)
Basophils Relative: 1 %
Eosinophils Absolute: 0.2 10*3/uL (ref 0.0–0.5)
Eosinophils Relative: 2 %
HCT: 39.7 % (ref 39.0–52.0)
Hemoglobin: 12.8 g/dL — ABNORMAL LOW (ref 13.0–17.0)
Immature Granulocytes: 0 %
Lymphocytes Relative: 19 %
Lymphs Abs: 1.9 10*3/uL (ref 0.7–4.0)
MCH: 30 pg (ref 26.0–34.0)
MCHC: 32.2 g/dL (ref 30.0–36.0)
MCV: 93 fL (ref 80.0–100.0)
Monocytes Absolute: 0.9 10*3/uL (ref 0.1–1.0)
Monocytes Relative: 8 %
Neutro Abs: 7.3 10*3/uL (ref 1.7–7.7)
Neutrophils Relative %: 70 %
Platelets: 206 10*3/uL (ref 150–400)
RBC: 4.27 MIL/uL (ref 4.22–5.81)
RDW: 14.1 % (ref 11.5–15.5)
WBC: 10.3 10*3/uL (ref 4.0–10.5)
nRBC: 0 % (ref 0.0–0.2)

## 2019-10-13 LAB — COMPREHENSIVE METABOLIC PANEL
ALT: 42 U/L (ref 0–44)
AST: 33 U/L (ref 15–41)
Albumin: 3.8 g/dL (ref 3.5–5.0)
Alkaline Phosphatase: 66 U/L (ref 38–126)
Anion gap: 10 (ref 5–15)
BUN: 21 mg/dL — ABNORMAL HIGH (ref 6–20)
CO2: 24 mmol/L (ref 22–32)
Calcium: 8.8 mg/dL — ABNORMAL LOW (ref 8.9–10.3)
Chloride: 105 mmol/L (ref 98–111)
Creatinine, Ser: 1.07 mg/dL (ref 0.61–1.24)
GFR calc Af Amer: >60 mL/min (ref >60)
GFR calc non Af Amer: >60 mL/min (ref >60)
Glucose, Bld: 73 mg/dL (ref 70–99)
Potassium: 4.2 mmol/L (ref 3.5–5.1)
Sodium: 139 mmol/L (ref 135–145)
Total Bilirubin: 1 mg/dL (ref 0.3–1.2)
Total Protein: 6.9 g/dL (ref 6.5–8.1)

## 2019-10-13 LAB — LIPASE, BLOOD: Lipase: 18 U/L (ref 11–51)

## 2019-10-13 MED ORDER — TAMSULOSIN HCL 0.4 MG PO CAPS
0.4000 mg | ORAL_CAPSULE | Freq: Once | ORAL | Status: AC
  Administered 2019-10-13: 0.4 mg via ORAL
  Filled 2019-10-13: qty 1

## 2019-10-13 MED ORDER — OXYCODONE-ACETAMINOPHEN 5-325 MG PO TABS: 1.0000 | ORAL_TABLET | Freq: Three times a day (TID) | ORAL | 0 refills | Status: AC | PRN

## 2019-10-13 MED ORDER — TAMSULOSIN HCL 0.4 MG PO CAPS: 0.4000 mg | ORAL_CAPSULE | Freq: Every day | ORAL | 0 refills | Status: AC

## 2019-10-13 MED ORDER — FENTANYL CITRATE (PF) 100 MCG/2ML IJ SOLN
50.0000 ug | Freq: Once | INTRAMUSCULAR | Status: AC
  Administered 2019-10-13: 50 ug via INTRAVENOUS
  Filled 2019-10-13: qty 2

## 2019-10-13 MED ORDER — KETOROLAC TROMETHAMINE 30 MG/ML IJ SOLN
30.0000 mg | Freq: Once | INTRAMUSCULAR | Status: AC
  Administered 2019-10-13: 30 mg via INTRAVENOUS
  Filled 2019-10-13: qty 1

## 2019-10-13 MED ORDER — ONDANSETRON HCL 4 MG/2ML IJ SOLN
4.0000 mg | Freq: Once | INTRAMUSCULAR | Status: AC
  Administered 2019-10-13: 4 mg via INTRAVENOUS
  Filled 2019-10-13: qty 2

## 2019-10-13 MED ORDER — ONDANSETRON 4 MG PO TBDP: ORAL_TABLET | ORAL | 0 refills | Status: AC

## 2019-10-13 MED ORDER — IBUPROFEN 800 MG PO TABS: 800.0000 mg | ORAL_TABLET | Freq: Three times a day (TID) | ORAL | 0 refills | Status: AC

## 2019-10-13 MED ORDER — LACTATED RINGERS IV BOLUS
1000.0000 mL | Freq: Once | INTRAVENOUS | Status: AC
Start: 2019-10-13 — End: 2019-10-13
  Administered 2019-10-13: 02:00:00 1000 mL via INTRAVENOUS

## 2019-10-13 MED ORDER — IOHEXOL 300 MG/ML  SOLN
100.0000 mL | Freq: Once | INTRAMUSCULAR | Status: AC | PRN
  Administered 2019-10-13: 100 mL via INTRAVENOUS

## 2019-10-13 NOTE — ED Triage Notes (Signed)
Pt c/o right side abdominal pain for the last couple of months with the pain getting worse over the last week; pt reports some nausea

## 2019-10-13 NOTE — ED Provider Notes (Signed)
[facility] EMERGENCY DEPARTMENT Provider Note   CSN: [id number] Arrival date & time: 10/13/19  0037     History Chief Complaint  Patient presents with  . Abdominal Pain    Devin Hodge is a 49 y.o. male.   Abdominal Pain Pain location:  RUQ Pain quality: not aching   Pain radiates to:  Does not radiate Pain severity:  Moderate Duration:  2 weeks Timing:  Intermittent Progression:  Worsening Chronicity:  New Context: not alcohol use, not eating, not recent illness, not sick contacts and not trauma   Relieved by:  None tried Worsened by:  Nothing Ineffective treatments:  None tried Associated symptoms: no anorexia        Past Medical History:  Diagnosis Date  . Anxiety   . Depression   . Drug addiction in remission (HCC)   . GERD (gastroesophageal reflux disease)   . H/O nonunion of fracture    right ankle  . Kidney stone   . PTSD (post-traumatic stress disorder)   . Status post alcohol detoxification     Patient Active Problem List   Diagnosis Date Noted  . 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 Cocaine abuse with cocaine-induced mood disorder (HCC) 10/15/2016  . Cocaine abuse (HCC) 02/23/2013  . Narcotic abuse (HCC) 02/23/2013  . Alcohol dependence (HCC) 05/26/2012  . Opioid dependence (HCC) 05/26/2012    Past Surgical History:  Procedure Laterality Date  . ORIF ANKLE FRACTURE Right 03/25/2018   Procedure: TAKE DOWN NON-UNION OPEN REDUCTION INTERNAL FIXATION (ORIF) RIGHT ANKLE FRACTURE;  Surgeon: Nadara Mustard, MD;  Location: [facility] OR;  Service: Orthopedics;  Laterality: Right;  . PLEURAL SCARIFICATION         Family History  Problem Relation Age of Onset  . Alcohol abuse Father   . Hypertension Father     Social History   Tobacco Use  . Smoking status: Current Every Day Smoker    Packs/day: 0.50    Years: 30.00    Pack  years: 15.00    Types: Cigarettes  . Smokeless tobacco: Former Neurosurgeon    Types: Snuff  Substance Use Topics  . Alcohol use: Not Currently  . Drug use: Not Currently    Types: Oxycodone, Marijuana, Cocaine    Comment: has not used in over a year pet patient- 03/25/18    Home Medications Prior to Admission medications   Medication Sig Start Date End Date Taking? Authorizing Provider  ibuprofen (ADVIL) 800 MG tablet Take 1 tablet (800 mg total) by mouth 3 (three) times daily. 10/13/19   Crestina Strike, Barbara Cower, MD  ondansetron (ZOFRAN ODT) 4 MG disintegrating tablet 4mg  ODT q4 hours prn nausea/vomit 10/13/19   Jakobi Thetford, 10/15/19, MD  oxyCODONE-acetaminophen (PERCOCET) 5-325 MG tablet Take 1 tablet by mouth every 8 (eight) hours as needed for severe pain. 10/13/19   Vinicio Lynk, 10/15/19, MD  tamsulosin (FLOMAX) 0.4 MG CAPS capsule Take 1 capsule (0.4 mg total) by mouth daily. 10/13/19   Darianna Amy, 10/15/19, MD    Allergies    Sulfa antibiotics  Review of Systems   Review of Systems  Gastrointestinal: Positive for abdominal pain. Negative for anorexia.  All other systems reviewed and are negative.   Physical Exam Updated Vital Signs BP 111/77 (BP Location: Left Arm)   Pulse 72   Temp 97.8 F (36.6 C) (Oral)   Resp 16   Ht 6' (1.829  m)   Wt 72.6 kg   SpO2 100%   BMI 21.70 kg/m   Physical Exam Vitals and nursing note reviewed.  Constitutional:      Appearance: He is well-developed.  HENT:     Head: Normocephalic and atraumatic.     Mouth/Throat:     Mouth: Mucous membranes are dry.     Pharynx: Oropharynx is clear.  Cardiovascular:     Rate and Rhythm: Normal rate.  Pulmonary:     Effort: Pulmonary effort is normal. No respiratory distress.  Abdominal:     General: There is no distension.  Musculoskeletal:        General: Normal range of motion.     Cervical back: Normal range of motion.  Skin:    General: Skin is warm and dry.  Neurological:     General: No focal deficit present.     Mental  Status: He is alert.     ED Results / Procedures / Treatments   Labs (all labs ordered are listed, but only abnormal results are displayed) Labs Reviewed  CBC WITH DIFFERENTIAL/PLATELET - Abnormal; Notable for the following components:      Result Value   Hemoglobin 12.8 (*)    All other components within normal limits  COMPREHENSIVE METABOLIC PANEL - Abnormal; Notable for the following components:   BUN 21 (*)    Calcium 8.8 (*)    All other components within normal limits  LIPASE, BLOOD    EKG None  Radiology CT ABDOMEN PELVIS W CONTRAST  Result Date: 10/13/2019 CLINICAL DATA:  Right-sided abdominal pain past month. EXAM: CT ABDOMEN AND PELVIS WITH CONTRAST TECHNIQUE: Multidetector CT imaging of the abdomen and pelvis was performed using the standard protocol following bolus administration of intravenous contrast. CONTRAST:  165mL OMNIPAQUE IOHEXOL 300 MG/ML  SOLN COMPARISON:  CT dated 12/30/2011 FINDINGS: Lower chest: The lung bases are clear. The heart size is normal. Hepatobiliary: The liver is normal. Normal gallbladder.There is no biliary ductal dilation. Pancreas: Normal contours without ductal dilatation. No peripancreatic fluid collection. Spleen: No splenic laceration or hematoma. Adrenals/Urinary Tract: --Adrenal glands: No adrenal hemorrhage. --Right kidney/ureter: There is a 1 cm stone in the right renal pelvis. There is a 3 mm stone in the distal right ureter causing mild right-sided hydroureteronephrosis. --Left kidney/ureter: No hydronephrosis or perinephric hematoma. --Urinary bladder: Unremarkable. Stomach/Bowel: --Stomach/Duodenum: No hiatal hernia or other gastric abnormality. Normal duodenal course and caliber. --Small bowel: No dilatation or inflammation. --Colon: No focal abnormality. --Appendix: Normal. Vascular/Lymphatic: Atherosclerotic calcification is present within the non-aneurysmal abdominal aorta, without hemodynamically significant stenosis. --No  retroperitoneal lymphadenopathy. --No mesenteric lymphadenopathy. --No pelvic or inguinal lymphadenopathy. Reproductive: Unremarkable Other: No ascites or free air. The abdominal wall is normal. Musculoskeletal. No acute displaced fractures. IMPRESSION: 1. There is a 3 mm stone in the distal right ureter causing mild right-sided hydroureteronephrosis. 2. There is a 1 cm stone in the right renal pelvis. Electronically Signed   By: Constance Holster M.D.   On: 10/13/2019 03:35    Procedures Procedures (including critical care time)  Medications Ordered in ED Medications  fentaNYL (SUBLIMAZE) injection 50 mcg (50 mcg Intravenous Given 10/13/19 0227)  ondansetron (ZOFRAN) injection 4 mg (4 mg Intravenous Given 10/13/19 0227)  lactated ringers bolus 1,000 mL (0 mLs Intravenous Stopped 10/13/19 0357)  iohexol (OMNIPAQUE) 300 MG/ML solution 100 mL (100 mLs Intravenous Contrast Given 10/13/19 0310)  ketorolac (TORADOL) 30 MG/ML injection 30 mg (30 mg Intravenous Given 10/13/19 0354)  tamsulosin (FLOMAX) capsule  0.4 mg (0.4 mg Oral Given 10/13/19 0354)    ED Course  I have reviewed the triage vital signs and the nursing notes.  Pertinent labs & imaging results that were available during my care of the patient were reviewed by me and considered in my medical decision making (see chart for details).    MDM Rules/Calculators/A&P                      Ultimately found to have kidney stone is likely caused her symptoms.  Will treat accordingly.  Urology follow-up if not improving.  Patient pain-free, tolerating p.o. at time of discharge.  Final Clinical Impression(s) / ED Diagnoses Final diagnoses:  Kidney stone    Rx / DC Orders ED Discharge Orders         Ordered    ibuprofen (ADVIL) 800 MG tablet  3 times daily     10/13/19 0423    tamsulosin (FLOMAX) 0.4 MG CAPS capsule  Daily     10/13/19 0423    ondansetron (ZOFRAN ODT) 4 MG disintegrating tablet     10/13/19 0423     oxyCODONE-acetaminophen (PERCOCET) 5-325 MG tablet  Every 8 hours PRN     10/13/19 0423           Braydin Aloi, Barbara Cower, MD 10/13/19 [id number]

## 2019-10-13 NOTE — ED Notes (Signed)
Patient denies pain and is resting comfortably.  

## 2024-07-18 ENCOUNTER — Emergency Department (HOSPITAL_COMMUNITY)
Admission: EM | Admit: 2024-07-18 | Discharge: 2024-07-18 | Disposition: A | Discharge status: Home / Self Care | Payer: MEDICAID | Source: Home / Self Care
